# Patient Record
Sex: Female | Born: 1965
Health system: Southern US, Community
[De-identification: ages and names within clinical notes are randomized; demographics above are authoritative.]

## PROBLEM LIST (undated history)

## (undated) DIAGNOSIS — I10 Essential (primary) hypertension: Secondary | ICD-10-CM

## (undated) DIAGNOSIS — E0590 Thyrotoxicosis, unspecified without thyrotoxic crisis or storm: Secondary | ICD-10-CM

## (undated) DIAGNOSIS — M1990 Unspecified osteoarthritis, unspecified site: Secondary | ICD-10-CM

## (undated) HISTORY — PX: CERVIX LESION DESTRUCTION: SHX591

## (undated) HISTORY — PX: AUGMENTATION MAMMAPLASTY: SUR837

## (undated) HISTORY — PX: BREAST SURGERY: SHX581

---

## 2008-11-22 ENCOUNTER — Emergency Department (HOSPITAL_COMMUNITY): Admission: EM | Admit: 2008-11-22 | Discharge: 2008-11-22 | Payer: Self-pay | Admitting: Family Medicine

## 2009-12-06 ENCOUNTER — Emergency Department (HOSPITAL_COMMUNITY): Admission: EM | Admit: 2009-12-06 | Discharge: 2009-12-06 | Payer: Self-pay | Admitting: Family Medicine

## 2009-12-29 ENCOUNTER — Emergency Department (HOSPITAL_COMMUNITY): Admission: EM | Admit: 2009-12-29 | Discharge: 2009-12-29 | Payer: Self-pay | Admitting: Emergency Medicine

## 2010-03-30 ENCOUNTER — Encounter: Admission: RE | Admit: 2010-03-30 | Discharge: 2010-03-30 | Payer: Self-pay | Admitting: Internal Medicine

## 2010-04-18 ENCOUNTER — Encounter (HOSPITAL_COMMUNITY)
Admission: RE | Admit: 2010-04-18 | Discharge: 2010-06-27 | Payer: Self-pay | Source: Home / Self Care | Attending: Internal Medicine | Admitting: Internal Medicine

## 2010-08-11 LAB — RPR: RPR Ser Ql: NONREACTIVE

## 2010-08-11 LAB — DIFFERENTIAL
Basophils Relative: 0 % (ref 0–1)
Lymphs Abs: 2.2 10*3/uL (ref 0.7–4.0)
Monocytes Relative: 9 % (ref 3–12)
Neutro Abs: 1.6 10*3/uL — ABNORMAL LOW (ref 1.7–7.7)
Neutrophils Relative %: 37 % — ABNORMAL LOW (ref 43–77)

## 2010-08-11 LAB — CBC
Hemoglobin: 12.9 g/dL (ref 12.0–15.0)
MCHC: 33.7 g/dL (ref 30.0–36.0)
RBC: 4.74 MIL/uL (ref 3.87–5.11)
WBC: 4.4 10*3/uL (ref 4.0–10.5)

## 2010-08-11 LAB — TSH: TSH: 0.004 u[IU]/mL — ABNORMAL LOW (ref 0.350–4.500)

## 2010-08-13 LAB — POCT URINALYSIS DIP (DEVICE)
Glucose, UA: NEGATIVE mg/dL
Specific Gravity, Urine: 1.02 (ref 1.005–1.030)
Urobilinogen, UA: 1 mg/dL (ref 0.0–1.0)

## 2010-08-13 LAB — WET PREP, GENITAL: Yeast Wet Prep HPF POC: NONE SEEN

## 2011-02-24 ENCOUNTER — Emergency Department (HOSPITAL_COMMUNITY)
Admission: EM | Admit: 2011-02-24 | Discharge: 2011-02-25 | Disposition: A | Discharge status: Home / Self Care | Payer: Self-pay | Attending: Emergency Medicine | Admitting: Emergency Medicine

## 2011-02-24 DIAGNOSIS — E0590 Thyrotoxicosis, unspecified without thyrotoxic crisis or storm: Secondary | ICD-10-CM | POA: Insufficient documentation

## 2011-02-24 DIAGNOSIS — N949 Unspecified condition associated with female genital organs and menstrual cycle: Secondary | ICD-10-CM | POA: Insufficient documentation

## 2011-02-24 LAB — URINALYSIS, ROUTINE W REFLEX MICROSCOPIC
Glucose, UA: NEGATIVE mg/dL
Ketones, ur: NEGATIVE mg/dL
Leukocytes, UA: NEGATIVE
pH: 7 (ref 5.0–8.0)

## 2011-02-24 LAB — WET PREP, GENITAL
Trich, Wet Prep: NONE SEEN
Yeast Wet Prep HPF POC: NONE SEEN

## 2011-02-25 ENCOUNTER — Emergency Department (HOSPITAL_COMMUNITY): Payer: Self-pay

## 2012-03-08 ENCOUNTER — Encounter (HOSPITAL_COMMUNITY): Payer: Self-pay | Admitting: Pharmacist

## 2012-03-13 ENCOUNTER — Encounter (HOSPITAL_COMMUNITY): Payer: Self-pay

## 2012-03-13 ENCOUNTER — Encounter (HOSPITAL_COMMUNITY)
Admission: RE | Admit: 2012-03-13 | Discharge: 2012-03-13 | Disposition: A | Discharge status: Home / Self Care | Payer: 59 | Source: Ambulatory Visit | Attending: Obstetrics and Gynecology | Admitting: Obstetrics and Gynecology

## 2012-03-13 HISTORY — DX: Thyrotoxicosis, unspecified without thyrotoxic crisis or storm: E05.90

## 2012-03-13 LAB — CBC
MCH: 28.1 pg (ref 26.0–34.0)
MCV: 85.7 fL (ref 78.0–100.0)
Platelets: 216 10*3/uL (ref 150–400)
RDW: 14.9 % (ref 11.5–15.5)

## 2012-03-13 NOTE — Patient Instructions (Signed)
Your procedure is scheduled on:03/20/12  Enter through the Main Entrance at :6am Pick up desk phone and dial [phone] and inform us of your arrival.  Please call [phone] if you have any problems the morning of surgery.  Remember: Do not eat or drink after midnight:Wed  Take these meds the morning of surgery with a sip of water:none  DO NOT wear jewelry, eye make-up, lipstick,body lotion, or dark fingernail polish. Do not shave for 48 hours prior to surgery.   Patients discharged on the day of surgery will not be allowed to drive home.

## 2012-03-19 NOTE — H&P (Signed)
Sonya Lin is an 46 y.o. female. She is interested in permanent sterility.  She saw Dr. Ambrose Mantle in March and had a Paragard removed, but apparently one of the short arms broke off during removal and may still be in the endometrial cavity.  Options for permanent sterility have been discussed, she wants laparoscopic BTL.  Pertinent Gynecological History: Last pap: normal Date: 07-2011 OB History: G4, P4004   Menstrual History:  No LMP recorded.    Past Medical History  Diagnosis Date  . Hyperthyroidism     Past Surgical History  Procedure Date  . Cervix lesion destruction   . Breast surgery     No family history on file.  Social History:  reports that she has never smoked. She does not have any smokeless tobacco history on file. She reports that she does not drink alcohol or use illicit drugs.  Allergies:  Allergies  Allergen Reactions  . Penicillins Hives    Rash; whelps  . Erythromycin Rash    Rash; GI upset    No prescriptions prior to admission    Review of Systems  Respiratory: Negative.   Cardiovascular: Negative.   Gastrointestinal: Negative.   Genitourinary: Negative.     There were no vitals taken for this visit. Physical Exam  Constitutional: She appears well-developed and well-nourished.  Cardiovascular: Normal rate, regular rhythm and normal heart sounds.   No murmur heard. Respiratory: Effort normal and breath sounds normal. No respiratory distress.  GI: Soft. She exhibits no distension and no mass. There is no tenderness.  Genitourinary: Vagina normal and uterus normal.       Uterus normal size No adnexal mass    No results found for this or any previous visit (from the past 24 hour(s)).  No results found.  Assessment/Plan: Desires surgical sterility, and may have a short arm of a Paragard IUD retained in her endometrial cavity.  All medical and surgical options have been discussed, permanency of BTL, failure rate, risks of surgery.  Will  admit for laparoscopic BTL and hysteroscopy to see if there is a piece of her Paragard retained and remove it.  Dante Roudebush D 03/19/2012, 9:53 AM

## 2012-03-20 ENCOUNTER — Ambulatory Visit (HOSPITAL_COMMUNITY)
Admission: RE | Admit: 2012-03-20 | Discharge: 2012-03-20 | Disposition: A | Discharge status: Home / Self Care | Payer: 59 | Source: Ambulatory Visit | Attending: Obstetrics and Gynecology | Admitting: Obstetrics and Gynecology

## 2012-03-20 ENCOUNTER — Encounter (HOSPITAL_COMMUNITY)
Admission: RE | Disposition: A | Discharge status: Home / Self Care | Payer: Self-pay | Source: Ambulatory Visit | Attending: Obstetrics and Gynecology

## 2012-03-20 ENCOUNTER — Encounter (HOSPITAL_COMMUNITY): Payer: Self-pay | Admitting: Anesthesiology

## 2012-03-20 ENCOUNTER — Ambulatory Visit (HOSPITAL_COMMUNITY): Payer: 59 | Admitting: Anesthesiology

## 2012-03-20 DIAGNOSIS — Z302 Encounter for sterilization: Secondary | ICD-10-CM | POA: Insufficient documentation

## 2012-03-20 DIAGNOSIS — Z309 Encounter for contraceptive management, unspecified: Secondary | ICD-10-CM

## 2012-03-20 HISTORY — PX: IUD REMOVAL: SHX5392

## 2012-03-20 HISTORY — PX: LAPAROSCOPIC TUBAL LIGATION: SHX1937

## 2012-03-20 HISTORY — PX: HYSTEROSCOPY: SHX211

## 2012-03-20 SURGERY — LIGATION, FALLOPIAN TUBE, LAPAROSCOPIC
Anesthesia: General | Wound class: Clean Contaminated

## 2012-03-20 MED ORDER — PROPOFOL 10 MG/ML IV EMUL
INTRAVENOUS | Status: DC | PRN
  Administered 2012-03-20: 140 mg via INTRAVENOUS

## 2012-03-20 MED ORDER — LIDOCAINE HCL (CARDIAC) 20 MG/ML IV SOLN
INTRAVENOUS | Status: AC
  Filled 2012-03-20: qty 5

## 2012-03-20 MED ORDER — MIDAZOLAM HCL 2 MG/2ML IJ SOLN: 0.5000 mg | Freq: Once | INTRAMUSCULAR | Status: DC | PRN

## 2012-03-20 MED ORDER — FENTANYL CITRATE 0.05 MG/ML IJ SOLN
INTRAMUSCULAR | Status: DC | PRN
  Administered 2012-03-20 (×2): 50 ug via INTRAVENOUS

## 2012-03-20 MED ORDER — PROMETHAZINE HCL 25 MG/ML IJ SOLN: 6.2500 mg | INTRAMUSCULAR | Status: DC | PRN

## 2012-03-20 MED ORDER — LIDOCAINE HCL (CARDIAC) 20 MG/ML IV SOLN
INTRAVENOUS | Status: DC | PRN
  Administered 2012-03-20: 50 mg via INTRAVENOUS

## 2012-03-20 MED ORDER — NEOSTIGMINE METHYLSULFATE 1 MG/ML IJ SOLN
INTRAMUSCULAR | Status: AC
  Filled 2012-03-20: qty 10

## 2012-03-20 MED ORDER — LIDOCAINE HCL 2 % IJ SOLN
INTRAMUSCULAR | Status: AC
  Filled 2012-03-20: qty 20

## 2012-03-20 MED ORDER — FENTANYL CITRATE 0.05 MG/ML IJ SOLN: 25.0000 ug | INTRAMUSCULAR | Status: DC | PRN

## 2012-03-20 MED ORDER — KETOROLAC TROMETHAMINE 30 MG/ML IJ SOLN: 15.0000 mg | Freq: Once | INTRAMUSCULAR | Status: DC | PRN

## 2012-03-20 MED ORDER — LACTATED RINGERS IV SOLN
INTRAVENOUS | Status: DC
  Administered 2012-03-20 (×2): via INTRAVENOUS

## 2012-03-20 MED ORDER — GLYCOPYRROLATE 0.2 MG/ML IJ SOLN
INTRAMUSCULAR | Status: AC
  Filled 2012-03-20: qty 2

## 2012-03-20 MED ORDER — OXYCODONE-ACETAMINOPHEN 5-325 MG PO TABS: 1.0000 | ORAL_TABLET | ORAL | Status: DC | PRN

## 2012-03-20 MED ORDER — BUPIVACAINE HCL (PF) 0.25 % IJ SOLN
INTRAMUSCULAR | Status: DC | PRN
  Administered 2012-03-20: 8 mL

## 2012-03-20 MED ORDER — BUPIVACAINE HCL (PF) 0.25 % IJ SOLN
INTRAMUSCULAR | Status: AC
  Filled 2012-03-20: qty 30

## 2012-03-20 MED ORDER — NEOSTIGMINE METHYLSULFATE 1 MG/ML IJ SOLN
INTRAMUSCULAR | Status: DC | PRN
  Administered 2012-03-20: 3 mg via INTRAVENOUS

## 2012-03-20 MED ORDER — MIDAZOLAM HCL 5 MG/5ML IJ SOLN
INTRAMUSCULAR | Status: DC | PRN
  Administered 2012-03-20: 1 mg via INTRAVENOUS

## 2012-03-20 MED ORDER — FENTANYL CITRATE 0.05 MG/ML IJ SOLN
INTRAMUSCULAR | Status: AC
  Filled 2012-03-20: qty 5

## 2012-03-20 MED ORDER — ONDANSETRON HCL 4 MG/2ML IJ SOLN
INTRAMUSCULAR | Status: AC
  Filled 2012-03-20: qty 2

## 2012-03-20 MED ORDER — GLYCOPYRROLATE 0.2 MG/ML IJ SOLN
INTRAMUSCULAR | Status: DC | PRN
  Administered 2012-03-20: 0.6 mg via INTRAVENOUS

## 2012-03-20 MED ORDER — ONDANSETRON HCL 4 MG/2ML IJ SOLN
INTRAMUSCULAR | Status: DC | PRN
  Administered 2012-03-20: 4 mg via INTRAVENOUS

## 2012-03-20 MED ORDER — GLYCINE 1.5 % IR SOLN
Status: DC | PRN
  Administered 2012-03-20: 3000 mL

## 2012-03-20 MED ORDER — PROPOFOL 10 MG/ML IV EMUL
INTRAVENOUS | Status: AC
  Filled 2012-03-20: qty 20

## 2012-03-20 MED ORDER — ROCURONIUM BROMIDE 100 MG/10ML IV SOLN
INTRAVENOUS | Status: DC | PRN
  Administered 2012-03-20: 10 mg via INTRAVENOUS
  Administered 2012-03-20: 20 mg via INTRAVENOUS

## 2012-03-20 MED ORDER — LIDOCAINE HCL 2 % IJ SOLN
INTRAMUSCULAR | Status: DC | PRN
  Administered 2012-03-20: 16 mL

## 2012-03-20 MED ORDER — LACTATED RINGERS IV SOLN
INTRAVENOUS | Status: DC
  Administered 2012-03-20: 125 mL/h via INTRAVENOUS

## 2012-03-20 MED ORDER — DEXAMETHASONE SODIUM PHOSPHATE 10 MG/ML IJ SOLN
INTRAMUSCULAR | Status: DC | PRN
  Administered 2012-03-20: 10 mg via INTRAVENOUS

## 2012-03-20 MED ORDER — KETOROLAC TROMETHAMINE 30 MG/ML IJ SOLN
INTRAMUSCULAR | Status: AC
  Filled 2012-03-20: qty 1

## 2012-03-20 MED ORDER — MEPERIDINE HCL 25 MG/ML IJ SOLN: 6.2500 mg | INTRAMUSCULAR | Status: DC | PRN

## 2012-03-20 MED ORDER — DEXAMETHASONE SODIUM PHOSPHATE 10 MG/ML IJ SOLN
INTRAMUSCULAR | Status: AC
  Filled 2012-03-20: qty 1

## 2012-03-20 MED ORDER — KETOROLAC TROMETHAMINE 30 MG/ML IJ SOLN
INTRAMUSCULAR | Status: DC | PRN
  Administered 2012-03-20: 30 mg via INTRAVENOUS

## 2012-03-20 MED ORDER — MIDAZOLAM HCL 2 MG/2ML IJ SOLN
INTRAMUSCULAR | Status: AC
  Filled 2012-03-20: qty 2

## 2012-03-20 SURGICAL SUPPLY — 33 items
ABLATOR ENDOMETRIAL BIPOLAR (ABLATOR) IMPLANT
CANISTER SUCTION 2500CC (MISCELLANEOUS) ×3 IMPLANT
CATH ROBINSON RED A/P 16FR (CATHETERS) ×3 IMPLANT
CHLORAPREP W/TINT 26ML (MISCELLANEOUS) ×3 IMPLANT
CLOTH BEACON ORANGE TIMEOUT ST (SAFETY) ×3 IMPLANT
CONTAINER PREFILL 10% NBF 60ML (FORM) ×6 IMPLANT
DERMABOND ADVANCED (GAUZE/BANDAGES/DRESSINGS) ×1
DERMABOND ADVANCED .7 DNX12 (GAUZE/BANDAGES/DRESSINGS) ×2 IMPLANT
DRESSING TELFA 8X3 (GAUZE/BANDAGES/DRESSINGS) ×3 IMPLANT
ELECT REM PT RETURN 9FT ADLT (ELECTROSURGICAL)
ELECTRODE REM PT RTRN 9FT ADLT (ELECTROSURGICAL) IMPLANT
GLOVE BIO SURGEON STRL SZ8 (GLOVE) ×3 IMPLANT
GLOVE ORTHO TXT STRL SZ7.5 (GLOVE) ×6 IMPLANT
GOWN PREVENTION PLUS LG XLONG (DISPOSABLE) ×3 IMPLANT
GOWN PREVENTION PLUS XLARGE (GOWN DISPOSABLE) ×3 IMPLANT
GOWN STRL REIN XL XLG (GOWN DISPOSABLE) ×6 IMPLANT
LOOP ANGLED CUTTING 22FR (CUTTING LOOP) IMPLANT
NEEDLE INSUFFLATION 14GA 120MM (NEEDLE) ×3 IMPLANT
NEEDLE SPNL 22GX3.5 QUINCKE BK (NEEDLE) ×3 IMPLANT
PACK HYSTEROSCOPY LF (CUSTOM PROCEDURE TRAY) ×3 IMPLANT
PACK LAPAROSCOPY BASIN (CUSTOM PROCEDURE TRAY) ×3 IMPLANT
PACK VAGINAL MINOR WOMEN LF (CUSTOM PROCEDURE TRAY) ×3 IMPLANT
PAD OB MATERNITY 4.3X12.25 (PERSONAL CARE ITEMS) ×3 IMPLANT
PAD PREP 24X48 CUFFED NSTRL (MISCELLANEOUS) ×3 IMPLANT
SUT VIC AB 3-0 CTX 36 (SUTURE) IMPLANT
SUT VIC AB 3-0 PS2 18 (SUTURE) ×1
SUT VIC AB 3-0 PS2 18XBRD (SUTURE) ×2 IMPLANT
SUT VICRYL 0 UR6 27IN ABS (SUTURE) IMPLANT
SYR CONTROL 10ML LL (SYRINGE) ×3 IMPLANT
TOWEL OR 17X24 6PK STRL BLUE (TOWEL DISPOSABLE) ×6 IMPLANT
TROCAR Z-THREAD FIOS 11X100 BL (TROCAR) ×3 IMPLANT
WARMER LAPAROSCOPE (MISCELLANEOUS) ×3 IMPLANT
WATER STERILE IRR 1000ML POUR (IV SOLUTION) ×3 IMPLANT

## 2012-03-20 NOTE — Anesthesia Postprocedure Evaluation (Signed)
  Anesthesia Post-op Note  Patient: Sonya Lin  Procedure(s) Performed: Procedure(s) (LRB) with comments: LAPAROSCOPIC TUBAL LIGATION (Bilateral) HYSTEROSCOPY (N/A) INTRAUTERINE DEVICE (IUD) REMOVAL (N/A) - Attempted Removal of Portion Intra-Uterine Device  Patient Location: PACU  Anesthesia Type: General  Level of Consciousness: awake, alert  and oriented  Airway and Oxygen Therapy: Patient Spontanous Breathing  Post-op Pain: none  Post-op Assessment: Post-op Vital signs reviewed, Patient's Cardiovascular Status Stable, Respiratory Function Stable, Patent Airway, No signs of Nausea or vomiting and Pain level controlled  Post-op Vital Signs: Reviewed and stable  Complications: No apparent anesthesia complications

## 2012-03-20 NOTE — Interval H&P Note (Signed)
History and Physical Interval Note:  03/20/2012 7:11 AM  Efraim Kaufmann Laural Benes  has presented today for surgery, with the diagnosis of retained portion of IUD, surgical sterility  The various methods of treatment have been discussed with the patient and family. After consideration of risks, benefits and other options for treatment, the patient has consented to  Procedure(s) (LRB) with comments: LAPAROSCOPIC TUBAL LIGATION (Bilateral) HYSTEROSCOPY (N/A) INTRAUTERINE DEVICE (IUD) REMOVAL (N/A) - remove portion of IUD as a surgical intervention .  The patient's history has been reviewed, patient examined, no change in status, stable for surgery.  I have reviewed the patient's chart and labs.  Questions were answered to the patient's satisfaction.     Loghan Subia D

## 2012-03-20 NOTE — Transfer of Care (Signed)
Immediate Anesthesia Transfer of Care Note  Patient: Sonya Lin  Procedure(s) Performed: Procedure(s) (LRB) with comments: LAPAROSCOPIC TUBAL LIGATION (Bilateral) HYSTEROSCOPY (N/A) INTRAUTERINE DEVICE (IUD) REMOVAL (N/A) - Attempted Removal of Portion Intra-Uterine Device  Patient Location: PACU  Anesthesia Type: General  Level of Consciousness: sedated  Airway & Oxygen Therapy: Patient Spontanous Breathing and Patient connected to nasal cannula oxygen  Post-op Assessment: Report given to PACU RN  Post vital signs: Reviewed and stable  Complications: No apparent anesthesia complications

## 2012-03-20 NOTE — Op Note (Signed)
Preoperative diagnosis: Desires surgical sterility, possible retained portion of IUD Postoperative diagnosis: Same Procedure: Laparoscopic bilateral tubal fulguration, hysteroscopy Surgeon: Lavina Hamman M.D. Anesthesia: Gen. Endotracheal tube Findings: She had a normal pelvis, normal uterus, tubes and ovaries, normal upper abdomen, appendix and gallbladder, normal endometrial cavity with no evidence of retained IUD Estimated blood loss: Minimal Complications: None  Procedure in detail: The patient was taken to the operating room and placed in the dorsosupine position. General anesthesia was induced. Her left arm was tucked to her side and legs were placed in mobile stirrups. Abdomen was then prepped and draped in the usual sterile fashion, bladder drained with a red Robinson catheter, Hulka tenaculum applied to the cervix for uterine manipulation. Infraumbilical skin was then infiltrated with quarter percent Marcaine and a 1 cm horizontal incision was made. The Veress needle was inserted into the peritoneal cavity and placement confirmed by the water drop test and an opening pressure of 3 mm of mercury. CO2 was insufflated to a pressure of 14 mm of mercury and the Veress needle was removed. A 10/11 disposable trocar was then introduced with direct visualization with the laparoscope. The operating scope was then inserted. Good visualization was achieved, inspection revealed the above-mentioned findings. Both fallopian tubes were easily identified and traced to their fimbriated ends. A 3 cm portion of the middle of each tube was fulgurated with bipolar cautery until the amp meter read 0 in all spots along a 3 cm segment. This was done on both sides with good fulguration of both tubes and good hemostasis. No complications were encountered. The laparoscope was removed. All gas was allowed to deflate from the abdomen and the trocar was removed. Fascia was approximated with one suture of 0 Vicryl. Skin  incision was closed with interrupted subcuticular sutures of 4-0 Vicryl followed by Dermabond.   Attention was turned vaginally.  The legs were elevated in the mobile stirrups.  The Hulka tenaculum was removed. A graves speculum was inserted, the anterior lip of the cervix was grasped with a single tooth tenaculum.  Paracervical block performed with 16cc of 2% plain lidocaine.  Uterus sounded to 8 cm, the cervix was easily dilated to a size 19 dilator.  The observer hysteroscope was inserted and good visualization achieved.  The endometrial cavity was normal except for a divot anteriorly which could be from her IUD or the Hulka tenaculum.  No pieces of her IUD were visualized.  The hysteroscope was removed.  The tenaculum was removed and bleeding controlled with pressure.  All instruments were removed from the vagina.  She was taken down from the stirrups.  The patient was awakened in the operating room and taken to the recovery in stable condition after tolerating the procedure well. Counts were correct and she had PAS hose on throughout the procedure.

## 2012-03-20 NOTE — Anesthesia Preprocedure Evaluation (Addendum)
Anesthesia Evaluation  Patient identified by MRN, date of birth, ID band Patient awake    Reviewed: Allergy & Precautions, H&P , Patient's Chart, lab work & pertinent test results, reviewed documented beta blocker date and time   History of Anesthesia Complications Negative for: history of anesthetic complications  Airway Mallampati: II TM Distance: >3 FB Neck ROM: full    Dental No notable dental hx.    Pulmonary neg pulmonary ROS,  breath sounds clear to auscultation  Pulmonary exam normal       Cardiovascular Exercise Tolerance: Good negative cardio ROS  Rhythm:regular Rate:Normal     Neuro/Psych negative neurological ROS  negative psych ROS   GI/Hepatic negative GI ROS, Neg liver ROS,   Endo/Other  negative endocrine ROSHyperthyroidism   Renal/GU negative Renal ROS     Musculoskeletal   Abdominal   Peds  Hematology negative hematology ROS (+)   Anesthesia Other Findings   Reproductive/Obstetrics negative OB ROS                           Anesthesia Physical Anesthesia Plan  ASA: II  Anesthesia Plan: General ETT   Post-op Pain Management:    Induction: Intravenous  Airway Management Planned: Oral ETT  Additional Equipment:   Intra-op Plan:   Post-operative Plan: Extubation in OR  Informed Consent: I have reviewed the patients History and Physical, chart, labs and discussed the procedure including the risks, benefits and alternatives for the proposed anesthesia with the patient or authorized representative who has indicated his/her understanding and acceptance.     Plan Discussed with: CRNA, Surgeon and Anesthesiologist  Anesthesia Plan Comments:        Anesthesia Quick Evaluation

## 2012-03-21 ENCOUNTER — Encounter (HOSPITAL_COMMUNITY): Payer: Self-pay | Admitting: Obstetrics and Gynecology

## 2012-06-05 ENCOUNTER — Emergency Department (INDEPENDENT_AMBULATORY_CARE_PROVIDER_SITE_OTHER)
Admission: EM | Admit: 2012-06-05 | Discharge: 2012-06-05 | Disposition: A | Discharge status: Home / Self Care | Payer: BC Managed Care – PPO | Source: Home / Self Care | Attending: Emergency Medicine | Admitting: Emergency Medicine

## 2012-06-05 ENCOUNTER — Emergency Department (INDEPENDENT_AMBULATORY_CARE_PROVIDER_SITE_OTHER): Payer: BC Managed Care – PPO

## 2012-06-05 ENCOUNTER — Encounter (HOSPITAL_COMMUNITY): Payer: Self-pay | Admitting: Emergency Medicine

## 2012-06-05 DIAGNOSIS — I10 Essential (primary) hypertension: Secondary | ICD-10-CM

## 2012-06-05 DIAGNOSIS — M5412 Radiculopathy, cervical region: Secondary | ICD-10-CM

## 2012-06-05 MED ORDER — TRAMADOL HCL 50 MG PO TABS: 50.0000 mg | ORAL_TABLET | Freq: Once | ORAL | Status: DC

## 2012-06-05 MED ORDER — TRAMADOL HCL 50 MG PO TABS: 50.0000 mg | ORAL_TABLET | Freq: Four times a day (QID) | ORAL | Status: DC | PRN

## 2012-06-05 MED ORDER — PREDNISONE 20 MG PO TABS
60.0000 mg | ORAL_TABLET | Freq: Once | ORAL | Status: AC
  Administered 2012-06-05: 60 mg via ORAL

## 2012-06-05 MED ORDER — PREDNISONE 20 MG PO TABS: 40.0000 mg | ORAL_TABLET | Freq: Every day | ORAL | Status: DC

## 2012-06-05 MED ORDER — HYDROCODONE-ACETAMINOPHEN 5-325 MG PO TABS: 1.0000 | ORAL_TABLET | ORAL | Status: DC | PRN

## 2012-06-05 MED ORDER — PREDNISONE 20 MG PO TABS
ORAL_TABLET | ORAL | Status: AC
  Filled 2012-06-05: qty 3

## 2012-06-05 NOTE — ED Notes (Signed)
Pt c/o of right arm pain since 05/28/12 Sx include: intermittent numbness; pain that's constant and increases w/acitivity, and nauseas Denies: inj/trauma, fevers, vomiting, diarrhea  She is alert w/no signs of acute distress.

## 2012-06-05 NOTE — ED Provider Notes (Signed)
History     CSN: [id number]  Arrival date & time 06/05/12  1744   First MD Initiated Contact with Patient 06/05/12 1745      Chief Complaint  Patient presents with  . Arm Pain    HPI: Patient is a 47 y.o. female presenting with arm pain. The history is provided by the patient.  Arm Pain This is a new problem. The current episode started more than 1 week ago. The problem occurs every several days. The problem has been gradually worsening. Pertinent negatives include no chest pain, no headaches and no shortness of breath. The symptoms are aggravated by bending and twisting. The symptoms are relieved by rest.  Pt reports onset of (R) arm pain on 05/28/2012 that seemed to resolve after 2-3 days. The pain was predominately in the (R) AC area and made it difficult to straighten her arm. 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 The pain returned 2 to 3 days ago and now is associated w/ intermittent numbness and tingling to the fingers on her (R) hand. Today at work she felt symptoms worsen while she was attempting to unload a delivery truck. She deneis CP, dizziness, h/a or other neurological symptoms. Denies neck pain or weakness to the (R) arm.   Past Medical History  Diagnosis Date  . Hyperthyroidism     Past Surgical History  Procedure Date  . Cervix lesion destruction   . Breast surgery   . Laparoscopic tubal ligation 03/20/2012    Procedure: LAPAROSCOPIC TUBAL LIGATION;  Surgeon: Lavina Hamman, MD;  Location: [facility];  Service: Gynecology;  Laterality: Bilateral;  . Hysteroscopy 03/20/2012    Procedure: HYSTEROSCOPY;  Surgeon: Lavina Hamman, MD;  Location: [facility];  Service: Gynecology;  Laterality: N/A;  . Iud removal 03/20/2012    Procedure: INTRAUTERINE DEVICE (IUD) REMOVAL;  Surgeon: Lavina Hamman, MD;  Location: [facility];  Service: Gynecology;  Laterality:  N/A;  Attempted Removal of Portion Intra-Uterine Device    No family history on file.  History  Substance Use Topics  . Smoking status: Never Smoker   . Smokeless tobacco: Not on file  . Alcohol Use: No    OB History    Grav Para Term Preterm Abortions TAB SAB Ect Mult Living                  Review of Systems  Constitutional: Negative.   HENT: Negative.   Eyes: Negative.   Respiratory: Negative.  Negative for shortness of breath.   Cardiovascular: Negative for chest pain.  Genitourinary: Negative.   Musculoskeletal: Negative for back pain, joint swelling and arthralgias.  Neurological: Positive for numbness. Negative for dizziness, light-headedness and headaches.  Hematological: Negative.   Psychiatric/Behavioral: Negative.     Allergies  Penicillins and Erythromycin  Home Medications   Current Outpatient Rx  Name  Route  Sig  Dispense  Refill  . VITAMIN D 2000 UNITS PO TABS   Oral   Take 2,000 Units by mouth daily.         Marland Kitchen METHIMAZOLE 10 MG PO TABS   Oral   Take 10 mg by mouth 2 (two) times daily.         . OXYCODONE-ACETAMINOPHEN 5-325 MG PO TABS   Oral   Take 1-2 tablets by mouth every 4 (four) hours as needed for pain.   20 tablet  0     BP 160/98  Pulse 68  Temp 98.3 F (36.8 C) (Oral)  Resp 20  SpO2 100%  LMP 05/11/2012  Physical Exam  Constitutional: She is oriented to person, place, and time. She appears well-developed and well-nourished.  HENT:  Head: Normocephalic and atraumatic.  Eyes: Conjunctivae normal are normal.  Neck: Normal range of motion. Neck supple. No spinous process tenderness and no muscular tenderness present.  Cardiovascular: Normal rate.   Pulmonary/Chest: Effort normal.  Musculoskeletal:       Right elbow: tenderness found.       Some subjective pain to (R) AC w/ passive ROM. No objective findings.  Neurological: She is alert and oriented to person, place, and time. She has normal strength. No cranial nerve  deficit.       Grips equal bil    ED Course  Procedures (including critical care time)  Labs Reviewed - No data to display No results found.   No diagnosis found.    MDM  Approx 10 day h/o intermittent (R) arm pain that now is associated w/ intermittent numbness/tingling to fingers on (R) hand. No neck pain. No weakness of limb. Grips strong, equal bil. C-Spine films neg. Suspect cervical radiculopathy given sx's, exam and physical nature of job. Will treat w/ course of steroids, medication for pain and rest. Advised to f/u w/ PCP regarding f/u BP and (R) pain if not improving.         Leanne Chang, NP 06/05/12 2211

## 2012-06-06 NOTE — ED Provider Notes (Signed)
Medical screening examination/treatment/procedure(s) were performed by non-physician practitioner and as supervising physician I was immediately available for consultation/collaboration.  Jaydalyn Demattia   Gwendloyn Forsee, MD 06/06/12 1002 

## 2013-05-28 HISTORY — PX: BREAST BIOPSY: SHX20

## 2013-10-23 ENCOUNTER — Encounter (HOSPITAL_COMMUNITY): Payer: Self-pay | Admitting: Emergency Medicine

## 2013-10-23 ENCOUNTER — Emergency Department (HOSPITAL_COMMUNITY)
Admission: EM | Admit: 2013-10-23 | Discharge: 2013-10-23 | Disposition: A | Discharge status: Home / Self Care | Payer: Medicaid Other | Attending: Emergency Medicine | Admitting: Emergency Medicine

## 2013-10-23 DIAGNOSIS — Z880 Allergy status to penicillin: Secondary | ICD-10-CM | POA: Insufficient documentation

## 2013-10-23 DIAGNOSIS — M25569 Pain in unspecified knee: Secondary | ICD-10-CM | POA: Insufficient documentation

## 2013-10-23 DIAGNOSIS — Z8639 Personal history of other endocrine, nutritional and metabolic disease: Secondary | ICD-10-CM | POA: Insufficient documentation

## 2013-10-23 DIAGNOSIS — IMO0002 Reserved for concepts with insufficient information to code with codable children: Secondary | ICD-10-CM | POA: Insufficient documentation

## 2013-10-23 DIAGNOSIS — Z862 Personal history of diseases of the blood and blood-forming organs and certain disorders involving the immune mechanism: Secondary | ICD-10-CM | POA: Insufficient documentation

## 2013-10-23 DIAGNOSIS — Z79899 Other long term (current) drug therapy: Secondary | ICD-10-CM | POA: Insufficient documentation

## 2013-10-23 MED ORDER — TRAMADOL HCL 50 MG PO TABS: 50.0000 mg | ORAL_TABLET | Freq: Four times a day (QID) | ORAL | Status: DC | PRN

## 2013-10-23 NOTE — ED Provider Notes (Signed)
CSN: [id number]     Arrival date & time 10/23/13  2030 History  This chart was scribed for non-physician practitioner, Junius Creamer, FNP,working with Mirna Mires, MD, by Marlowe Kays, ED Scribe.  This patient was seen in room WTR8/WTR8 and the patient's care was started at 8:59 PM.  Chief Complaint  Patient presents with  . Knee Pain   The history is provided by the patient. No language interpreter was used.   HPI Comments:  Sonya Lin is a 48 y.o. female who presents to the Emergency Department complaining of intermittent aching posterior left knee pain that started about two weeks ago. Pt states sitting too long makes the pain worse. She states she has been putting a pillow under her knee for comfort at night. She states she has been taking Ibuprofen for the pain with mild relief. She denies taking oral contraceptive pills or any hormone therapy. She denies h/o DVT. Pt denies fever or numbness or tingling of the LLE. There is no redness, warmth or swelling of the area. She states she does not have a PCP.   Past Medical History  Diagnosis Date  . Hyperthyroidism    Past Surgical History  Procedure Laterality Date  . Cervix lesion destruction    . Breast surgery    . Laparoscopic tubal ligation  03/20/2012    Procedure: LAPAROSCOPIC TUBAL LIGATION;  Surgeon: Cheri Fowler, MD;  Location: [facility];  Service: Gynecology;  Laterality: Bilateral;  . Hysteroscopy  03/20/2012    Procedure: HYSTEROSCOPY;  Surgeon: Cheri Fowler, MD;  Location: [facility];  Service: Gynecology;  Laterality: N/A;  . Iud removal  03/20/2012    Procedure: INTRAUTERINE DEVICE (IUD) REMOVAL;  Surgeon: Cheri Fowler, MD;  Location: [facility];  Service: Gynecology;  Laterality: N/A;  Attempted Removal of Portion Intra-Uterine Device   History reviewed. No pertinent family history. History  Substance Use Topics  . Smoking status: Never Smoker   . Smokeless tobacco: Not on file  . Alcohol Use: No   OB History   Grav Para Term Preterm Abortions TAB SAB Ect Mult Living                 Review of Systems  Constitutional: Negative for fever.  Musculoskeletal: Positive for arthralgias (left knee).  Neurological: Negative for numbness.  All other systems reviewed and are negative.   Allergies  Penicillins and Erythromycin  Home Medications   Prior to Admission medications   Medication Sig Start Date End Date Taking? Authorizing Provider  Cholecalciferol (VITAMIN D) 2000 UNITS tablet Take 2,000 Units by mouth daily.    Historical Provider, MD  HYDROcodone-acetaminophen (NORCO/VICODIN) 5-325 MG per tablet Take 1 tablet by mouth every 4 (four) hours as needed for pain. 06/05/12   Rhetta Mura Schorr, NP  methimazole (TAPAZOLE) 10 MG tablet Take 10 mg by mouth 2 (two) times daily.    Historical Provider, MD  oxyCODONE-acetaminophen (ROXICET) 5-325 MG per tablet Take 1-2 tablets by mouth every 4 (four) hours as needed for pain. 03/20/12   Cheri Fowler, MD  predniSONE (DELTASONE) 20 MG tablet Take 2 tablets (40 mg total) by mouth daily. 06/05/12   Rhetta Mura Schorr, NP  traMADol (ULTRAM) 50 MG tablet Take 1 tablet (50 mg total) by mouth every 6 (six) hours as needed. 10/23/13   Garald Balding, NP   Triage Vitals: BP 142/86  Pulse 81  Temp(Src) 98.4 F (36.9 C)  Resp 20  Ht 4\' 6"  (1.372 m)  Wt 118  lb (53.524 kg)  BMI 28.43 kg/m2 Physical Exam  Nursing note and vitals reviewed. Constitutional: She is oriented to person, place, and time. She appears well-developed and well-nourished.  HENT:  Head: Normocephalic and atraumatic.  Eyes: EOM are normal.  Neck: Normal range of motion.  Cardiovascular: Normal rate.   Pulmonary/Chest: Effort normal.  Musculoskeletal: Normal range of motion. She exhibits no edema and no tenderness.  No extremity swelling. No discreet swelling of the posterior left knee. Unable to reproduce the pain by palpation.  Neurological: She is alert and oriented to person, place, and  time.  Skin: Skin is warm and dry.  Psychiatric: She has a normal mood and affect. Her behavior is normal.    ED Course  Procedures (including critical care time)  COORDINATION OF CARE: 9:03 PM- Advised pt to return tomorrow to have ultrasound of posterior left knee. Will prescribe pain medication. Pt verbalizes understanding and agrees to plan.  Medications - No data to display  Labs Review Labs Reviewed - No data to display  Imaging Review No results found.   EKG Interpretation None      MDM   Final diagnoses:  Knee joint pain       I personally performed the services described in this documentation, which was scribed in my presence. The recorded information has been reviewed and is accurate.    Garald Balding, NP 10/23/13 2117

## 2013-10-23 NOTE — Discharge Instructions (Signed)
Heat Therapy Heat therapy can help ease achy, tense, stiff, and tight muscles and joints. Heat should not be used on new injuries. Wait at least 48 hours after the injury before using heat therapy. Heat also should not be used for discomfort or pain that occurs right after doing an activity. If you still have pain or stiffness 3 hours after finishing the activity, then heat therapy may be used. PRECAUTIONS  High heat or prolonged exposure to heat can cause burns. Be careful when using heat therapy to avoid burning your skin. If you have any of the following conditions, do not use heat until you have discussed heat therapy with your caregiver:  Poor circulation.  Healing wounds or scarred skin in the area being treated.  Diabetes, heart disease, or high blood pressure.  Numbness of the area being treated.  Unusual swelling of the area being treated.  Active infections.  Blood clots.  Cancer.  Inability to communicate your response to pain. This can include young children and people with dementia. HOME CARE INSTRUCTIONS Moist heat pack  Soak a clean towel in warm water, and squeeze out the extra water. The water temperature should be comfortable to the skin.  Put the warm, wet towel in a plastic bag.  Place a thin, dry towel between your skin and the bag.  Put the heat pack on the area for 5 minutes, and check your skin. Your skin may be pink, but it should not be red.  Leave the heat pack on the area for a total of 15 to 30 minutes.  Repeat this every 2 to 4 hours while awake. Do not use heat while you are sleeping. Warm water bath  Fill a tub with warm water. The water temperature should be comfortable to the skin.  Place the affected body part in the tub.  Soak the area for 20 to 40 minutes.  Repeat as needed. Hot water bottle  Fill the water bottle half full with hot water.  Press out the extra air. Close the cap tightly.  Place a dry towel between your skin and  the bottle.  Put the bottle on the area for 5 minutes, and check your skin. Your skin may be pink, but it should not be red.  Leave the bottle on the area for a total of 15 to 30 minutes.  Repeat this every 2 to 4 hours while awake. Electric heating pad  Place a dry towel between your skin and the heating pad.  Set the heating pad on low heat.  Put the heating pad on the area for 10 minutes, and check your skin. Your skin may be pink, but it should not be red.  Leave the heating pad on the area for a total of 20 to 40 minutes.  Repeat this every 2 to 4 hours while awake.  Do not lie on the heating pad.  Do not fall asleep while using the heating pad.  Do not use the heating pad near water. Contact with water can result in an electrical shock. SEEK MEDICAL CARE IF:  You have blisters, redness, swelling, or numbness.  You have any new problems.  Your problems are getting worse.  You have any questions or concerns. If you develop any problems, stop using heat therapy until you see your caregiver. MAKE SURE YOU:  Understand these instructions.  Will watch your condition.  Will get help right away if you are not doing well or get worse. Document Released: 08/06/2011  Document Reviewed: 08/06/2011 °[employer]® Patient Information ©2014 [employer]. ° °

## 2013-10-23 NOTE — ED Notes (Signed)
Pt states that she is having pain that comes and goes in severe in the posterior of her Lt knee. Pt denies injury or pain to other location. Pain worsens with inactivity.

## 2013-10-24 ENCOUNTER — Ambulatory Visit (HOSPITAL_COMMUNITY)
Admission: RE | Admit: 2013-10-24 | Discharge: 2013-10-24 | Disposition: A | Discharge status: Home / Self Care | Payer: Medicaid Other | Source: Ambulatory Visit | Attending: Internal Medicine | Admitting: Internal Medicine

## 2013-10-24 DIAGNOSIS — M79609 Pain in unspecified limb: Secondary | ICD-10-CM | POA: Insufficient documentation

## 2013-10-24 DIAGNOSIS — M7989 Other specified soft tissue disorders: Secondary | ICD-10-CM | POA: Insufficient documentation

## 2013-10-24 NOTE — Progress Notes (Signed)
*  PRELIMINARY RESULTS* Vascular Ultrasound Left lower extremity venous duplex has been completed.  Preliminary findings: No evidence of DVT or baker's cyst.   Landry Mellow, RDMS, RVT  10/24/2013, 8:57 AM

## 2013-10-24 NOTE — ED Provider Notes (Signed)
Medical screening examination/treatment/procedure(s) were performed by non-physician practitioner and as supervising physician I was immediately available for consultation/collaboration.     Mirna Mires, MD 10/24/13 [phone]

## 2013-10-26 NOTE — Progress Notes (Signed)
VASCULAR LAB PRELIMINARY  PRELIMINARY  PRELIMINARY  PRELIMINARY  Left lower extremity venous duplex completed.    Preliminary report:  Left:  No evidence of DVT, superficial thrombosis, or Baker's cyst.  Sonya Lin, RVS 10/26/2013, 10:01 AM

## 2013-12-28 ENCOUNTER — Other Ambulatory Visit: Payer: Self-pay | Admitting: Family Medicine

## 2013-12-28 DIAGNOSIS — Z1231 Encounter for screening mammogram for malignant neoplasm of breast: Secondary | ICD-10-CM

## 2014-01-05 ENCOUNTER — Ambulatory Visit
Admission: RE | Admit: 2014-01-05 | Discharge: 2014-01-05 | Disposition: A | Discharge status: Home / Self Care | Payer: Medicaid Other | Source: Ambulatory Visit | Attending: Family Medicine | Admitting: Family Medicine

## 2014-01-05 DIAGNOSIS — Z1231 Encounter for screening mammogram for malignant neoplasm of breast: Secondary | ICD-10-CM

## 2014-01-08 ENCOUNTER — Other Ambulatory Visit: Payer: Self-pay | Admitting: Family Medicine

## 2014-01-08 DIAGNOSIS — R928 Other abnormal and inconclusive findings on diagnostic imaging of breast: Secondary | ICD-10-CM

## 2014-01-19 ENCOUNTER — Ambulatory Visit
Admission: RE | Admit: 2014-01-19 | Discharge: 2014-01-19 | Disposition: A | Discharge status: Home / Self Care | Payer: Medicaid Other | Source: Ambulatory Visit | Attending: Family Medicine | Admitting: Family Medicine

## 2014-01-19 ENCOUNTER — Other Ambulatory Visit: Payer: Self-pay | Admitting: Family Medicine

## 2014-01-19 DIAGNOSIS — R928 Other abnormal and inconclusive findings on diagnostic imaging of breast: Secondary | ICD-10-CM

## 2014-01-29 ENCOUNTER — Ambulatory Visit
Admission: RE | Admit: 2014-01-29 | Discharge: 2014-01-29 | Disposition: A | Discharge status: Home / Self Care | Payer: Medicaid Other | Source: Ambulatory Visit | Attending: Family Medicine | Admitting: Family Medicine

## 2014-01-29 DIAGNOSIS — R928 Other abnormal and inconclusive findings on diagnostic imaging of breast: Secondary | ICD-10-CM

## 2015-05-30 IMAGING — MG MM DIAGNOSTIC UNILATERAL R
2 series · 2 of 2 positions shown · non-contrast
Comparison: 01/05/2014 and 03/30/2010.

CLINICAL DATA: Recall from screening mammogram.

EXAM:
DIGITAL DIAGNOSTIC  right breast MAMMOGRAM

[R CC]
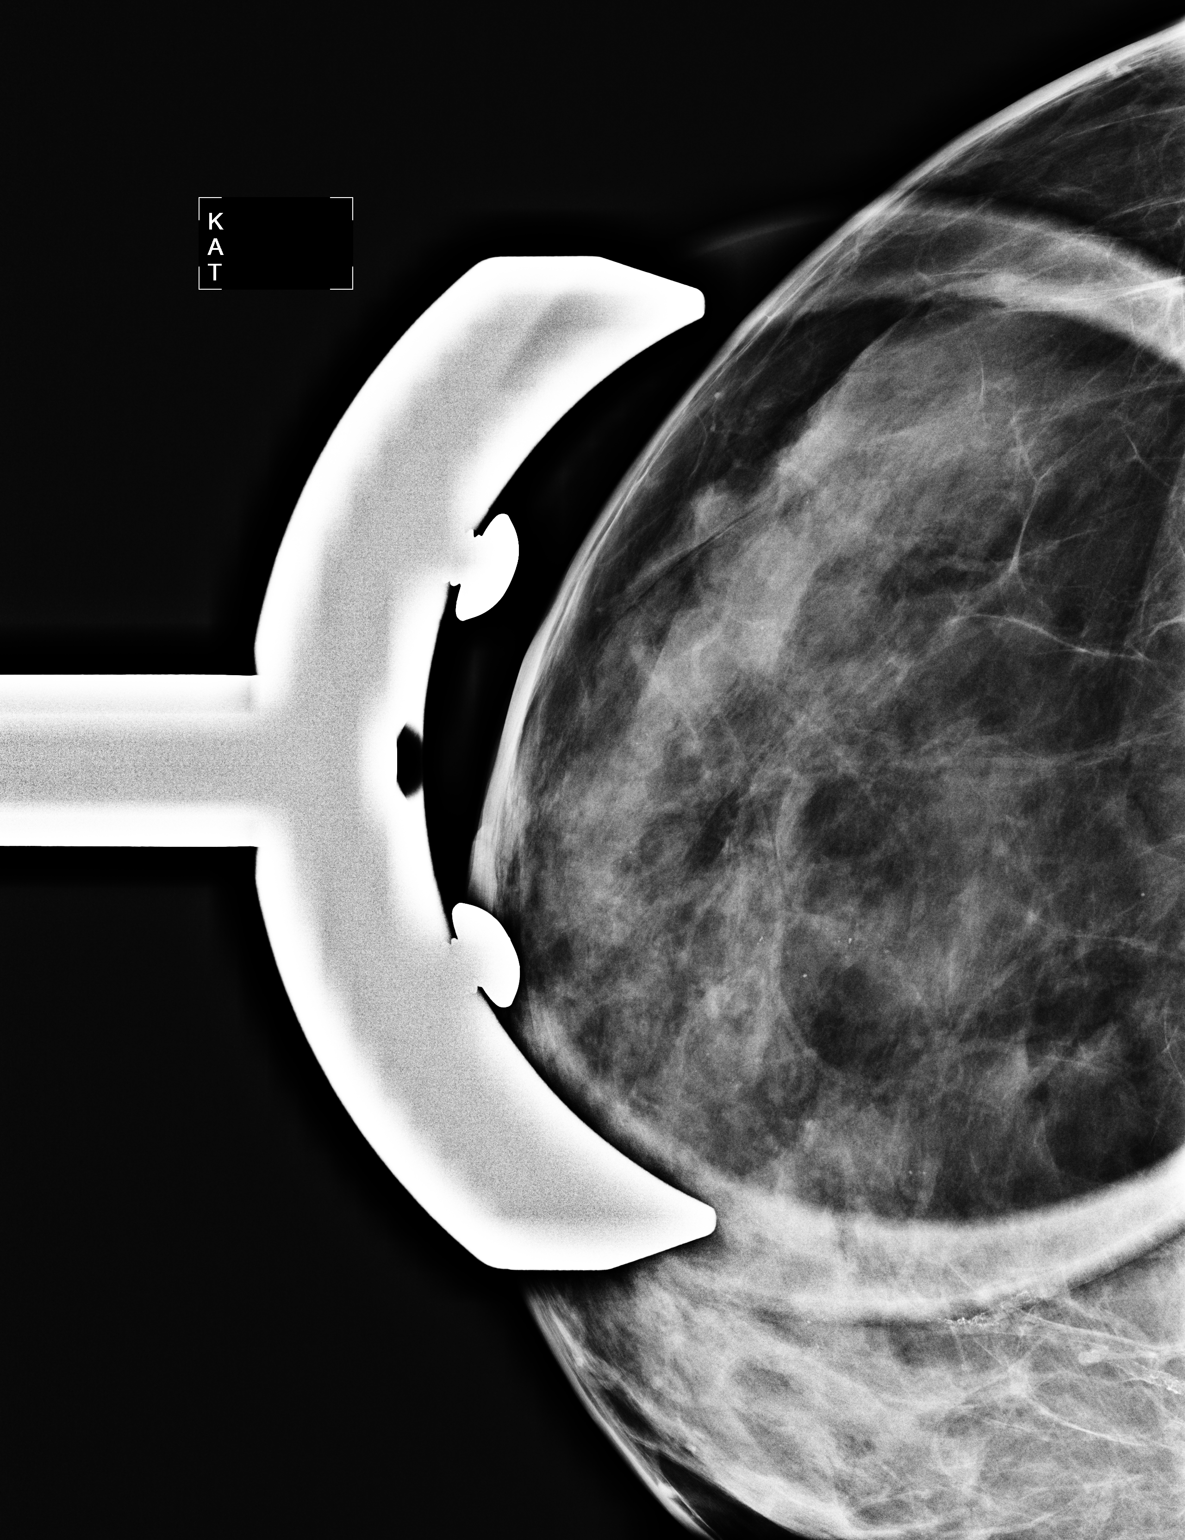

[R ML]
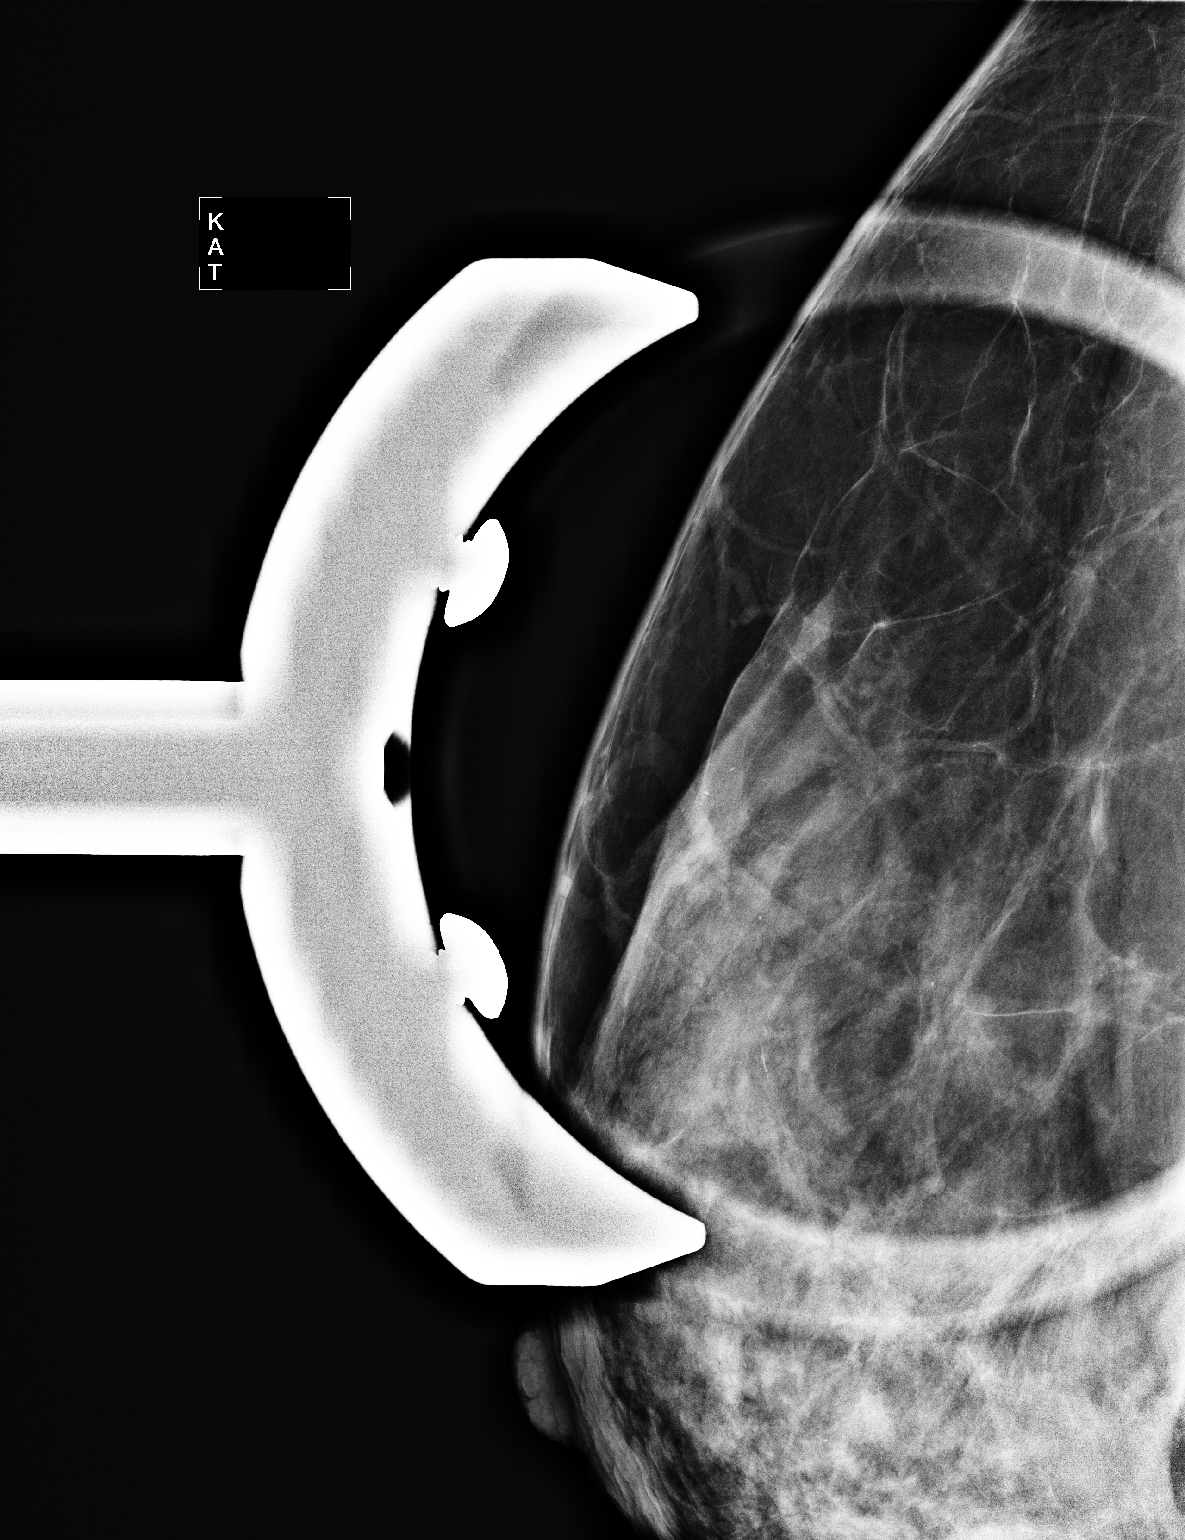

[2 of 2 positions shown; findings below may reference images not displayed]

ACR Breast Density Category c: The breast tissue is heterogeneously
dense, which may obscure small masses.
FINDINGS: Magnification views of the right breast demonstrate a group of
pleomorphic microcalcifications with linear forms located within the
right breast at the 12 o'clock position (anterior [DATE]). These are
arranged in a linear distribution and span 11 mm. There are
scattered punctate calcifications also present within the right
breast. Tissue sampling is recommended. I have discussed
stereotactic core biopsy of the calcifications with the patient.
This is scheduled for 01/29/2014.
IMPRESSION: Group of suspicious calcifications located superiorly within the
right breast at 12 o'clock position which span 1.1 cm. Tissue
sampling is recommended and stereotactic core biopsy is scheduled
for 01/29/2014.

RECOMMENDATION:
Right breast stereotactic core biopsy.

I have discussed the findings and recommendations with the patient.
Results were also provided in writing at the conclusion of the
visit. If applicable, a reminder letter will be sent to the patient
regarding the next appointment.

BI-RADS CATEGORY  4: Suspicious.

## 2015-06-28 ENCOUNTER — Other Ambulatory Visit: Payer: Self-pay

## 2015-06-28 DIAGNOSIS — N644 Mastodynia: Secondary | ICD-10-CM

## 2015-09-30 ENCOUNTER — Other Ambulatory Visit: Payer: Self-pay | Admitting: Nurse Practitioner

## 2015-09-30 DIAGNOSIS — Z1231 Encounter for screening mammogram for malignant neoplasm of breast: Secondary | ICD-10-CM

## 2015-12-29 ENCOUNTER — Emergency Department (HOSPITAL_COMMUNITY)
Admission: EM | Admit: 2015-12-29 | Discharge: 2015-12-30 | Disposition: A | Discharge status: Home / Self Care | Payer: BLUE CROSS/BLUE SHIELD | Attending: Emergency Medicine | Admitting: Emergency Medicine

## 2015-12-29 ENCOUNTER — Encounter (HOSPITAL_COMMUNITY): Payer: Self-pay | Admitting: Emergency Medicine

## 2015-12-29 DIAGNOSIS — M79602 Pain in left arm: Secondary | ICD-10-CM | POA: Insufficient documentation

## 2015-12-29 NOTE — ED Provider Notes (Signed)
[facility] DEPT Provider Note   CSN: [id number] Arrival date & time: 12/29/15  2106  First Provider Contact:  None       History   Chief Complaint Chief Complaint  Patient presents with  . Arm Pain    HPI Sonya Lin is a 50 y.o. female.  HPI   50 year old female presenting with L arm pain.  Pt report 3 days of L arm achiness, involving the lateral side of upper arm.  Pain is mild, no recent injury.  Pain with extension of the wrist and palpation.  No specific treatment tried.  Denies fever, chills, numbness, tingling, or weakness.  Pt is R arm dominant.  No hx of PE/DVT, no recent surgery, prolonged bedrest, recent travel, active cancer or taking oral hormone.  No neck pain, headache or fever.   Past Medical History:  Diagnosis Date  . Hyperthyroidism     Patient Active Problem List   Diagnosis Date Noted  . Contraception management 03/20/2012    Past Surgical History:  Procedure Laterality Date  . BREAST SURGERY    . CERVIX LESION DESTRUCTION    . HYSTEROSCOPY  03/20/2012   Procedure: HYSTEROSCOPY;  Surgeon: Cheri Fowler, MD;  Location: [facility];  Service: Gynecology;  Laterality: N/A;  . IUD REMOVAL  03/20/2012   Procedure: INTRAUTERINE DEVICE (IUD) REMOVAL;  Surgeon: Cheri Fowler, MD;  Location: [facility];  Service: Gynecology;  Laterality: N/A;  Attempted Removal of Portion Intra-Uterine Device  . LAPAROSCOPIC TUBAL LIGATION  03/20/2012   Procedure: LAPAROSCOPIC TUBAL LIGATION;  Surgeon: Cheri Fowler, MD;  Location: [facility];  Service: Gynecology;  Laterality: Bilateral;    OB History    No data available       Home Medications    Prior to Admission medications   Medication Sig Start Date End Date Taking? Authorizing Provider  Cholecalciferol (VITAMIN D) 2000 UNITS tablet Take 2,000 Units by mouth daily.    Historical Provider, MD  HYDROcodone-acetaminophen (NORCO/VICODIN) 5-325 MG per tablet Take 1 tablet by mouth every 4 (four) hours as needed for  pain. 06/05/12   Rhetta Mura Schorr, NP  methimazole (TAPAZOLE) 10 MG tablet Take 10 mg by mouth 2 (two) times daily.    Historical Provider, MD  oxyCODONE-acetaminophen (ROXICET) 5-325 MG per tablet Take 1-2 tablets by mouth every 4 (four) hours as needed for pain. 03/20/12   Cheri Fowler, MD  predniSONE (DELTASONE) 20 MG tablet Take 2 tablets (40 mg total) by mouth daily. 06/05/12   Rhetta Mura Schorr, NP  traMADol (ULTRAM) 50 MG tablet Take 1 tablet (50 mg total) by mouth every 6 (six) hours as needed. 10/23/13   Junius Creamer, NP    Family History No family history on file.  Social History Social History  Substance Use Topics  . Smoking status: Never Smoker  . Smokeless tobacco: Never Used  . Alcohol use No     Allergies   Penicillins and Erythromycin   Review of Systems Review of Systems  Constitutional: Negative for fever.  Skin: Negative for rash and wound.  Neurological: Negative for numbness.     Physical Exam Updated Vital Signs BP 145/83 (BP Location: Right Arm)   Pulse 84   Temp 98.1 F (36.7 C) (Oral)   Resp 20   Ht 4\' 7"  (1.397 m)   Wt 53.1 kg   SpO2 98%   BMI 27.19 kg/m   Physical Exam  Constitutional: She appears well-developed and well-nourished. No distress.  HENT:  Head:  Atraumatic.  Eyes: Conjunctivae are normal.  Neck: Neck supple.  Musculoskeletal: She exhibits tenderness (L arm: tenderness along the anterior region of bicep and posterior aspect of forearm without overlying skin changes or deformity.  NVI.  no rash.  FROM). She exhibits no edema.  Neurological: She is alert.  Skin: No rash noted.  Psychiatric: She has a normal mood and affect.  Nursing note and vitals reviewed.    ED Treatments / Results  Labs (all labs ordered are listed, but only abnormal results are displayed) Labs Reviewed - No data to display  EKG  EKG Interpretation None       Radiology No results found.  Procedures Procedures (including critical care  time)  Medications Ordered in ED Medications - No data to display   Initial Impression / Assessment and Plan / ED Course  I have reviewed the triage vital signs and the nursing notes.  Pertinent labs & imaging results that were available during my care of the patient were reviewed by me and considered in my medical decision making (see chart for details).  Clinical Course    BP 145/83 (BP Location: Right Arm)   Pulse 84   Temp 98.1 F (36.7 C) (Oral)   Resp 20   Ht 4\' 7"  (1.397 m)   Wt 53.1 kg   SpO2 98%   BMI 27.19 kg/m    Final Clinical Impressions(s) / ED Diagnoses   Final diagnoses:  Left arm pain    New Prescriptions New Prescriptions   No medications on file   12:06 AM Pt has reproducible L arm pain.  No signs concerning for infection or broken bones.  She is NVI.  Doubt DVT.  Suspect MSK.  Will treat with naproxen.  Return precaution discussed.     Domenic Moras, PA-C 12/30/15 0007    Tanna Furry, MD 01/10/16 [phone]

## 2015-12-29 NOTE — ED Triage Notes (Signed)
Patient presents for left arm pain radiating from right above elbow to left wrist x1 day. Denies injury to same, no SOB, no lightheadedness or dizziness, no numbness or tingling, no back or neck pain.

## 2015-12-30 MED ORDER — NAPROXEN 500 MG PO TABS: 500.0000 mg | ORAL_TABLET | Freq: Two times a day (BID) | ORAL | 0 refills | Status: DC

## 2020-05-27 ENCOUNTER — Ambulatory Visit (HOSPITAL_COMMUNITY): Payer: Self-pay

## 2020-06-01 ENCOUNTER — Encounter (HOSPITAL_COMMUNITY): Payer: Self-pay

## 2020-06-01 ENCOUNTER — Ambulatory Visit (HOSPITAL_COMMUNITY)
Admission: EM | Admit: 2020-06-01 | Discharge: 2020-06-01 | Disposition: A | Discharge status: Home / Self Care | Payer: BLUE CROSS/BLUE SHIELD

## 2020-06-01 DIAGNOSIS — M25512 Pain in left shoulder: Secondary | ICD-10-CM

## 2020-06-01 HISTORY — DX: Essential (primary) hypertension: I10

## 2020-06-01 MED ORDER — MELOXICAM 15 MG PO TABS: 15.0000 mg | ORAL_TABLET | Freq: Every day | ORAL | 0 refills | Status: DC

## 2020-06-01 NOTE — ED Provider Notes (Signed)
[facility]    CSN: [id number] Arrival date & time: 06/01/20  0932      History   Chief Complaint Chief Complaint  Patient presents with  . Shoulder Pain    HPI Sonya Lin is a 55 y.o. female with history of hypertension presents to urgent care with 1 month history of left shoulder pain. Patient describes dull, nagging pain for 1 month. States trouble sleeping as she likes to sleep on the side and is uncomfortable. 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 Yesterday with burning sensation in anterior shoulder. Denies any trauma, no numbness, weakness or tingling. Has taken ibuprofen with little relief. She is unable to identify any specific aggravating factors.   Past Medical History:  Diagnosis Date  . Hypertension     There are no problems to display for this patient.   Past Surgical History:  Procedure Laterality Date  . BREAST SURGERY      OB History   No obstetric history on file.      Home Medications    Prior to Admission medications   Medication Sig Start Date End Date Taking? Authorizing Provider  amLODipine (NORVASC) 5 MG tablet Take 5 mg by mouth daily.   Yes [provider]  meloxicam (MOBIC) 15 MG tablet Take 1 tablet (15 mg total) by mouth daily. 06/01/20 06/01/21 Yes Rolla Etienne, NP    Family History Family History  Problem Relation Age of Onset  . Healthy Mother   . Stroke Father   . Hypertension Father   . Diabetes Father     Social History Social History   Tobacco Use  . Smoking status: Never Smoker  . Smokeless tobacco: Never Used  Vaping Use  . Vaping Use: Never used  Substance Use Topics  . Alcohol use: Not Currently  . Drug use: Never     Allergies   Erythromycin and Penicillins   Review of Systems As stated in HPI otherwise negative   Physical Exam Triage Vital Signs ED Triage Vitals  Enc Vitals Group     BP 06/01/20 1201 140/85     Pulse Rate 06/01/20  1201 67     Resp 06/01/20 1201 16     Temp 06/01/20 1201 97.6 F (36.4 C)     Temp Source 06/01/20 1201 Oral     SpO2 06/01/20 1201 100 %     Weight 06/01/20 1157 115 lb (52.2 kg)     Height 06/01/20 1157 4\' 7"  (1.397 m)     Head Circumference --      Peak Flow --      Pain Score 06/01/20 1157 5     Pain Loc --      Pain Edu? --      Excl. in GC? --    No data found.  Updated Vital Signs BP 140/85 (BP Location: Right Arm)   Pulse 67   Temp 97.6 F (36.4 C) (Oral)   Resp 16   Ht 4\' 7"  (1.397 m)   Wt 52.2 kg   SpO2 100%   BMI 26.73 kg/m   Visual Acuity Right Eye Distance:   Left Eye Distance:   Bilateral Distance:    Right Eye Near:   Left Eye Near:    Bilateral Near:     Physical Exam Constitutional:      General: She is not in acute distress.    Appearance: Normal appearance. She is normal weight. She is not  ill-appearing.  HENT:     Head: Normocephalic and atraumatic.  Musculoskeletal:        General: Tenderness present. No swelling, deformity or signs of injury. Normal range of motion.     Cervical back: Normal range of motion and neck supple. No tenderness.     Comments: Normal range of motion. TTP along anterolateral joint space. No crepitus or popping. Normal strength. Discomfort exacerbated with arm raised overhead. No evidence of impingement  Neurological:     Mental Status: She is alert.      UC Treatments / Results  Labs (all labs ordered are listed, but only abnormal results are displayed) Labs Reviewed - No data to display  EKG   Radiology No results found.  Procedures Procedures (including critical care time)  Medications Ordered in UC Medications - No data to display  Initial Impression / Assessment and Plan / UC Course  I have reviewed the triage vital signs and the nursing notes.  Pertinent labs & imaging results that were available during my care of the patient were reviewed by me and considered in my medical decision making  (see chart for details).  Shoulder pain, left -Consider rotator cuff injury versus labral tear vs OA -Referral to orthopedics for further evaluation -NSAIDs, ice, rest  Reviewed expections re: course of current medical issues. Questions answered. Outlined signs and symptoms indicating need for more acute intervention. Pt verbalized understanding. AVS given   Final Clinical Impressions(s) / UC Diagnoses   Final diagnoses:  Pain in joint of left shoulder     Discharge Instructions     I am concerned you may have an injury to your rotator cuff.  I have referred you to an orthopedic doctor for further evaluation.  In the meantime ice, anti-inflammatory as we discussed and rest when able.    ED Prescriptions    Medication Sig Dispense Auth. Provider   meloxicam (MOBIC) 15 MG tablet Take 1 tablet (15 mg total) by mouth daily. 30 tablet Rudolpho Sevin, NP     PDMP not reviewed this encounter.   Rudolpho Sevin, NP 06/01/20 1258

## 2020-06-01 NOTE — Discharge Instructions (Signed)
I am concerned you may have an injury to your rotator cuff.  I have referred you to an orthopedic doctor for further evaluation.  In the meantime ice, anti-inflammatory as we discussed and rest when able.

## 2020-06-01 NOTE — ED Triage Notes (Signed)
Pt c/o left shoulder pain for approx 1 month. States pain increases, especially to anterior area with movement. 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

## 2020-08-02 ENCOUNTER — Emergency Department (HOSPITAL_COMMUNITY): Payer: BLUE CROSS/BLUE SHIELD

## 2020-08-02 ENCOUNTER — Other Ambulatory Visit: Payer: Self-pay

## 2020-08-02 ENCOUNTER — Encounter (HOSPITAL_COMMUNITY): Payer: Self-pay

## 2020-08-02 ENCOUNTER — Emergency Department (HOSPITAL_COMMUNITY)
Admission: EM | Admit: 2020-08-02 | Discharge: 2020-08-03 | Disposition: A | Discharge status: Home / Self Care | Payer: BLUE CROSS/BLUE SHIELD | Attending: Emergency Medicine | Admitting: Emergency Medicine

## 2020-08-02 DIAGNOSIS — M19012 Primary osteoarthritis, left shoulder: Secondary | ICD-10-CM | POA: Insufficient documentation

## 2020-08-02 DIAGNOSIS — M25512 Pain in left shoulder: Secondary | ICD-10-CM

## 2020-08-02 DIAGNOSIS — R202 Paresthesia of skin: Secondary | ICD-10-CM | POA: Diagnosis not present

## 2020-08-02 DIAGNOSIS — Z79899 Other long term (current) drug therapy: Secondary | ICD-10-CM | POA: Insufficient documentation

## 2020-08-02 DIAGNOSIS — E039 Hypothyroidism, unspecified: Secondary | ICD-10-CM | POA: Insufficient documentation

## 2020-08-02 DIAGNOSIS — I10 Essential (primary) hypertension: Secondary | ICD-10-CM | POA: Insufficient documentation

## 2020-08-02 DIAGNOSIS — M19019 Primary osteoarthritis, unspecified shoulder: Secondary | ICD-10-CM

## 2020-08-02 MED ORDER — IBUPROFEN 400 MG PO TABS
600.0000 mg | ORAL_TABLET | Freq: Once | ORAL | Status: AC
  Administered 2020-08-03: 600 mg via ORAL
  Filled 2020-08-02: qty 1

## 2020-08-02 MED ORDER — OXYCODONE-ACETAMINOPHEN 5-325 MG PO TABS
1.0000 | ORAL_TABLET | Freq: Once | ORAL | Status: AC
  Administered 2020-08-03: 1 via ORAL
  Filled 2020-08-02: qty 1

## 2020-08-02 NOTE — ED Provider Notes (Signed)
[facility] EMERGENCY DEPARTMENT Provider Note   CSN: [id number] Arrival date & time: 08/02/20  1945     History Chief Complaint  Patient presents with  . Shoulder Pain    Sonya Lin is a 55 y.o. female.  Patient to ED with complaint of recurrent left shoulder pain x 3 weeks without injury or trauma. She has received cortisone injections in the past but has been unable to follow up with her orthopedist secondary to financial restraints. No swelling redness or fever. She reports tingling into the fingers of the left hand. Denies weakness. She has been applying ice to the joint without relief.   The history is provided by the patient. No language interpreter was used.  Shoulder Pain Associated symptoms: no neck pain        Past Medical History:  Diagnosis Date  . Hypertension   . Hyperthyroidism     Patient Active Problem List   Diagnosis Date Noted  . Contraception management 03/20/2012    Past Surgical History:  Procedure Laterality Date  . BREAST SURGERY    . CERVIX LESION DESTRUCTION    . HYSTEROSCOPY  03/20/2012   Procedure: HYSTEROSCOPY;  Surgeon: Cheri Fowler, MD;  Location: [facility];  Service: Gynecology;  Laterality: N/A;  . IUD REMOVAL  03/20/2012   Procedure: INTRAUTERINE DEVICE (IUD) REMOVAL;  Surgeon: Cheri Fowler, MD;  Location: [facility];  Service: Gynecology;  Laterality: N/A;  Attempted Removal of Portion Intra-Uterine Device  . LAPAROSCOPIC TUBAL LIGATION  03/20/2012   Procedure: LAPAROSCOPIC TUBAL LIGATION;  Surgeon: Cheri Fowler, MD;  Location: [facility];  Service: Gynecology;  Laterality: Bilateral;     OB History   No obstetric history on file.     Family History  Problem Relation Age of Onset  . Healthy Mother   . Stroke Father   . Hypertension Father   . Diabetes Father     Social History   Tobacco Use  . Smoking status: Never Smoker  . Smokeless tobacco: Never Used  Vaping Use  . Vaping Use: Never used   Substance Use Topics  . Alcohol use: No  . Drug use: No    Home Medications Prior to Admission medications   Medication Sig Start Date End Date Taking? Authorizing Provider  amLODipine (NORVASC) 5 MG tablet Take 5 mg by mouth daily.    [provider]  Cholecalciferol (VITAMIN D) 2000 UNITS tablet Take 2,000 Units by mouth daily.    [provider]  HYDROcodone-acetaminophen (NORCO/VICODIN) 5-325 MG per tablet Take 1 tablet by mouth every 4 (four) hours as needed for pain. 06/05/12   Schorr, Rhetta Mura, FNP  meloxicam (MOBIC) 15 MG tablet Take 1 tablet (15 mg total) by mouth daily. 06/01/20 06/01/21  Rudolpho Sevin, NP  methimazole (TAPAZOLE) 10 MG tablet Take 10 mg by mouth 2 (two) times daily.    [provider]  naproxen (NAPROSYN) 500 MG tablet Take 1 tablet (500 mg total) by mouth 2 (two) times daily. 12/30/15   Domenic Moras, PA-C  oxyCODONE-acetaminophen (ROXICET) 5-325 MG per tablet Take 1-2 tablets by mouth every 4 (four) hours as needed for pain. 03/20/12   Meisinger, Todd, MD  predniSONE (DELTASONE) 20 MG tablet Take 2 tablets (40 mg total) by mouth daily. 06/05/12   Schorr, Rhetta Mura, FNP  traMADol (ULTRAM) 50 MG tablet Take 1 tablet (50 mg total) by mouth every 6 (six) hours as needed. 10/23/13   Junius Creamer, NP    Allergies  Penicillins, Erythromycin, Erythromycin, and Penicillins  Review of Systems   Review of Systems  Constitutional: Negative for chills.  Cardiovascular: Negative.   Gastrointestinal: Negative.  Negative for nausea.  Musculoskeletal: Negative for neck pain.       See HPI.  Skin: Negative.  Negative for color change.  Neurological: Negative.  Negative for weakness.       Tingling sensation to left fingers.    Physical Exam Updated Vital Signs BP (!) 176/97 (BP Location: Right Arm)   Pulse 72   Temp 98 F (36.7 C)   Resp 17   Ht 4\' 7"  (1.397 m)   Wt 52.2 kg   SpO2 100%   BMI 26.75 kg/m   Physical Exam Constitutional:       Appearance: She is well-developed and well-nourished.  Pulmonary:     Effort: Pulmonary effort is normal.  Musculoskeletal:     Cervical back: Normal range of motion.     Comments: Left shoulder has preserved range of motion. There is no swelling of the shoulder joint. No redness or warmth. Point tenderness over the [facility] joint, no deformity. Grip strength WNL. No midline or paracervical tenderness.   Skin:    General: Skin is warm and dry.  Neurological:     Mental Status: She is alert and oriented to person, place, and time.     Sensory: No sensory deficit.     ED Results / Procedures / Treatments   Labs (all labs ordered are listed, but only abnormal results are displayed) Labs Reviewed - No data to display  EKG None  Radiology DG Shoulder Left  Result Date: 08/02/2020 CLINICAL DATA:  Left shoulder pain radiating to neck, cortisone injection 2 weeks ago EXAM: LEFT SHOULDER - 2+ VIEW COMPARISON:  None. FINDINGS: Frontal and transscapular views of the left shoulder are obtained. Alignment is anatomic. Mild hypertrophic changes of the acromioclavicular joint. Glenohumeral joint space is well preserved. Left chest is clear. IMPRESSION: 1. Mild acromioclavicular joint osteoarthritis. No acute bony abnormality. Electronically Signed   By: Randa Ngo M.D.   On: 08/02/2020 20:44    Procedures Procedures   Medications Ordered in ED Medications  ibuprofen (ADVIL) tablet 600 mg (has no administration in time range)  oxyCODONE-acetaminophen (PERCOCET/ROXICET) 5-325 MG per tablet 1 tablet (has no administration in time range)    ED Course  I have reviewed the triage vital signs and the nursing notes.  Pertinent labs & imaging results that were available during my care of the patient were reviewed by me and considered in my medical decision making (see chart for details).    MDM Rules/Calculators/A&P                          Patient to ED with recurrent left shoulder pain  without injury. Has received injections in the past by ortho but unable to follow up with them currently.   No evidence of infection, injury. No cervical pain or tenderness, doubt radiculopathy. On imaging, there is AC joint osteoarthritis which is felt likely to be the cause of her pain.   Will provide ibuprofen, pain medication. Refer to PCP for further coordination of care.  Final Clinical Impression(s) / ED Diagnoses Final diagnoses:  None   1. Left shoulder arthritis 2. Acute left shoulder pain  Rx / DC Orders ED Discharge Orders    None       Charlann Lange, PA-C 73/22/02 [id number]    Delora Fuel,  MD 08/03/20 [phone]

## 2020-08-02 NOTE — ED Triage Notes (Signed)
Patient reports L shoulder pain, states she has some numbness in her fingers and the pain is starting to move to her neck. Patient reports hx of issues with same shoulder, had a cortisone shot that helped but now is having more pain.

## 2020-08-03 MED ORDER — IBUPROFEN 600 MG PO TABS: 600.0000 mg | ORAL_TABLET | Freq: Four times a day (QID) | ORAL | 0 refills | Status: DC | PRN

## 2020-08-03 MED ORDER — OXYCODONE-ACETAMINOPHEN 5-325 MG PO TABS: 1.0000 | ORAL_TABLET | Freq: Four times a day (QID) | ORAL | 0 refills | Status: DC | PRN

## 2020-08-03 NOTE — Discharge Instructions (Addendum)
Take ibuprofen as prescribed for pain and inflammation. This is best if taken on a regular basis. Take Percocet for pain as needed.   Follow up with your primary care provider for further management of recurrent shoulder pain. If you do not have a primary care provider, you can call the [facility] to make an appointment.

## 2020-08-25 ENCOUNTER — Ambulatory Visit (HOSPITAL_COMMUNITY)
Admission: RE | Admit: 2020-08-25 | Discharge: 2020-08-25 | Disposition: A | Discharge status: Home / Self Care | Payer: Medicaid Other | Source: Ambulatory Visit | Attending: Emergency Medicine | Admitting: Emergency Medicine

## 2020-08-25 ENCOUNTER — Encounter (HOSPITAL_COMMUNITY): Payer: Self-pay

## 2020-08-25 ENCOUNTER — Other Ambulatory Visit: Payer: Self-pay

## 2020-08-25 VITALS — BP 144/82 | HR 67 | Temp 98.3°F | Resp 16

## 2020-08-25 DIAGNOSIS — M25512 Pain in left shoulder: Secondary | ICD-10-CM

## 2020-08-25 MED ORDER — MELOXICAM 7.5 MG PO TABS: 7.5000 mg | ORAL_TABLET | Freq: Every day | ORAL | 0 refills | Status: DC

## 2020-08-25 NOTE — Discharge Instructions (Signed)
Take the meloxicam as prescribed.  Rest your shoulder.  Apply ice packs 2-3 times a day for up to 20 minutes each.    Follow up with your orthopedist.

## 2020-08-25 NOTE — ED Triage Notes (Signed)
Patient presents to Urgent Care with complaints of left shoulder pain describes it as burning sensation that radiates to left lower arm. Pt states this is an on-going issue since Jan. That she was treated for at ortho but due to loss of insurance unable to follow-up with ortho. She was seen again at the ED dx with arthritis and rx percocet which she is unable to tolerate. Pt states treating discomfort with ibuprofen and ice with some relief.   Denies fever, numbness or tingling sensation.

## 2020-08-25 NOTE — ED Provider Notes (Signed)
[place]    CSN: [id number] Arrival date & time: 08/25/20  0847      History   Chief Complaint Chief Complaint  Patient presents with  . Appointment    0900 left shoulder pain     HPI Sonya Lin is a 55 y.o. female.   Patient presents with ongoing left shoulder pain x3 months.  No falls or injury.  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  She reports intermittent "burning sensation" in her left shoulder and radiating down her left arm.  She denies weakness, numbness, wounds, redness, bruising, or other symptoms.  Patient was seen here on 06/01/2020; diagnosed with pain in the left shoulder; treated with meloxicam and instructed to follow-up with orthopedics.  Patient states she was seen by [facility] orthopedics and received an injection in her shoulder; she has not seen the orthopedist since January.  She was seen at [facility] ED on 08/02/2020; diagnosed with acute pain of left shoulder and shoulder arthritis; x-ray showed osteoarthritis; treated with ibuprofen and oxycodone-acetaminophen.  The history is provided by the patient and medical records.    Past Medical History:  Diagnosis Date  . Hypertension   . Hyperthyroidism     Patient Active Problem List   Diagnosis Date Noted  . Contraception management 03/20/2012    Past Surgical History:  Procedure Laterality Date  . BREAST SURGERY    . CERVIX LESION DESTRUCTION    . HYSTEROSCOPY  03/20/2012   Procedure: HYSTEROSCOPY;  Surgeon: Cheri Fowler, MD;  Location: [place] [facility];  Service: Gynecology;  Laterality: N/A;  . IUD REMOVAL  03/20/2012   Procedure: INTRAUTERINE DEVICE (IUD) REMOVAL;  Surgeon: Cheri Fowler, MD;  Location: [place] [facility];  Service: Gynecology;  Laterality: N/A;  Attempted Removal of Portion Intra-Uterine Device  . LAPAROSCOPIC TUBAL LIGATION  03/20/2012   Procedure: LAPAROSCOPIC TUBAL LIGATION;  Surgeon: Cheri Fowler, MD;  Location: [place] [facility];  Service:  Gynecology;  Laterality: Bilateral;    OB History   No obstetric history on file.      Home Medications    Prior to Admission medications   Medication Sig Start Date End Date Taking? Authorizing Provider  meloxicam (MOBIC) 7.5 MG tablet Take 1 tablet (7.5 mg total) by mouth daily. 08/25/20  Yes Sharion Balloon, NP  amLODipine (NORVASC) 5 MG tablet Take 5 mg by mouth daily.    [provider]  Cholecalciferol (VITAMIN D) 2000 UNITS tablet Take 2,000 Units by mouth daily.    [provider]  methimazole (TAPAZOLE) 10 MG tablet Take 10 mg by mouth 2 (two) times daily.    [provider]    Family History Family History  Problem Relation Age of Onset  . Healthy Mother   . Stroke Father   . Hypertension Father   . Diabetes Father     Social History Social History   Tobacco Use  . Smoking status: Never Smoker  . Smokeless tobacco: Never Used  Vaping Use  . Vaping Use: Never used  Substance Use Topics  . Alcohol use: No  . Drug use: No     Allergies   Penicillins, Erythromycin, Erythromycin, and Penicillins   Review of Systems Review of Systems  Constitutional: Negative for chills and fever.  HENT: Negative for ear pain and sore throat.   Eyes: Negative for pain and visual disturbance.  Respiratory: Negative for cough and shortness of breath.   Cardiovascular: Negative for  chest pain and palpitations.  Gastrointestinal: Negative for abdominal pain and vomiting.  Genitourinary: Negative for dysuria and hematuria.  Musculoskeletal: Positive for arthralgias. Negative for back pain and joint swelling.  Skin: Negative for color change, rash and wound.  Neurological: Negative for syncope, weakness and numbness.  All other systems reviewed and are negative.    Physical Exam Triage Vital Signs ED Triage Vitals  Enc Vitals Group     BP      Pulse      Resp      Temp      Temp src      SpO2      Weight      Height      Head  Circumference      Peak Flow      Pain Score      Pain Loc      Pain Edu?      Excl. in [place]?    No data found.  Updated Vital Signs BP (!) 144/82 (BP Location: Right Arm)   Pulse 67   Temp 98.3 F (36.8 C) (Oral)   Resp 16   SpO2 99%   Visual Acuity Right Eye Distance:   Left Eye Distance:   Bilateral Distance:    Right Eye Near:   Left Eye Near:    Bilateral Near:     Physical Exam Vitals and nursing note reviewed.  Constitutional:      General: She is not in acute distress.    Appearance: She is well-developed. She is not ill-appearing.  HENT:     Head: Normocephalic and atraumatic.     Mouth/Throat:     Mouth: Mucous membranes are moist.  Eyes:     Conjunctiva/sclera: Conjunctivae normal.  Cardiovascular:     Rate and Rhythm: Normal rate and regular rhythm.     Heart sounds: Normal heart sounds.  Pulmonary:     Effort: Pulmonary effort is normal. No respiratory distress.     Breath sounds: Normal breath sounds.  Abdominal:     Palpations: Abdomen is soft.     Tenderness: There is no abdominal tenderness.  Musculoskeletal:        General: Tenderness present. No swelling or deformity. Normal range of motion.     Cervical back: Neck supple.     Comments: Left shoulder: Tender to palpation of muscles.  No wounds, erythema, ecchymosis.  FROM, sensation intact, strength 5/5.  Skin:    General: Skin is warm and dry.     Capillary Refill: Capillary refill takes less than 2 seconds.     Findings: No bruising, erythema, lesion or rash.  Neurological:     General: No focal deficit present.     Mental Status: She is alert and oriented to person, place, and time.     Sensory: No sensory deficit.     Motor: No weakness.     Gait: Gait normal.  Psychiatric:        Mood and Affect: Mood normal.        Behavior: Behavior normal.      UC Treatments / Results  Labs (all labs ordered are listed, but only abnormal results are displayed) Labs Reviewed - No data to  display  EKG   Radiology No results found.  Procedures Procedures (including critical care time)  Medications Ordered in UC Medications - No data to display  Initial Impression / Assessment and Plan / UC Course  I have reviewed the triage vital signs  and the nursing notes.  Pertinent labs & imaging results that were available during my care of the patient were reviewed by me and considered in my medical decision making (see chart for details).   Acute left shoulder pain.  Treating with meloxicam.  Instructed patient to continue applying ice packs and rest her shoulder.  Discussed that she needs to follow-up with her orthopedist; She states she was previously seen by [facility].  Work note provided per patient request.  She agrees to plan of care.   Final Clinical Impressions(s) / UC Diagnoses   Final diagnoses:  Acute pain of left shoulder     Discharge Instructions     Take the meloxicam as prescribed.  Rest your shoulder.  Apply ice packs 2-3 times a day for up to 20 minutes each.    Follow up with your orthopedist.       ED Prescriptions    Medication Sig Dispense Auth. Provider   meloxicam (MOBIC) 7.5 MG tablet Take 1 tablet (7.5 mg total) by mouth daily. 14 tablet Sharion Balloon, NP     I have reviewed the PDMP during this encounter.   Sharion Balloon, NP 08/25/20 [phone]

## 2020-09-18 ENCOUNTER — Ambulatory Visit: Payer: Self-pay

## 2020-12-16 ENCOUNTER — Encounter: Payer: Self-pay | Admitting: Family Medicine

## 2020-12-16 ENCOUNTER — Other Ambulatory Visit: Payer: Self-pay

## 2020-12-16 ENCOUNTER — Ambulatory Visit (INDEPENDENT_AMBULATORY_CARE_PROVIDER_SITE_OTHER): Payer: BLUE CROSS/BLUE SHIELD | Admitting: Family Medicine

## 2020-12-16 VITALS — BP 149/85 | HR 73 | Ht <= 58 in | Wt 128.4 lb

## 2020-12-16 DIAGNOSIS — M25512 Pain in left shoulder: Secondary | ICD-10-CM | POA: Diagnosis not present

## 2020-12-16 DIAGNOSIS — G8929 Other chronic pain: Secondary | ICD-10-CM

## 2020-12-16 DIAGNOSIS — E0500 Thyrotoxicosis with diffuse goiter without thyrotoxic crisis or storm: Secondary | ICD-10-CM

## 2020-12-16 DIAGNOSIS — I10 Essential (primary) hypertension: Secondary | ICD-10-CM | POA: Diagnosis not present

## 2020-12-16 MED ORDER — AMLODIPINE BESYLATE 5 MG PO TABS
5.0000 mg | ORAL_TABLET | Freq: Every day | ORAL | 3 refills | Status: DC
Start: 2020-12-16 — End: 2021-03-05

## 2020-12-16 NOTE — Progress Notes (Signed)
   SUBJECTIVE:   CHIEF COMPLAINT / HPI:      Sonya Lin is a 55 y.o. female here to establish care. Reports shoulder pain.    New Patient : PMHx: reviewed    Meds: reviewed    ALL: reviewed    SurgHx: reviewed    [id number]   FMHx: reviewed    Social Hx:  Lives with her husband. Has 4 children.  Works at [employer]. Gets her exercise at work. Denies smoking, alcohol and illicit drug use.   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 Taking Tylenol arthritis for pain with relief. Tried cold and heat compress. Pain is worse in the morning and it feels stiff.    PERTINENT  PMH / PSH: reviewed and updated as appropriate   OBJECTIVE:   BP (!) 149/85   Pulse 73   Ht '4\' 7"'$  (1.397 m)   Wt 128 lb 6.4 oz (58.2 kg)   SpO2 98%   BMI 29.84 kg/m    GEN: pleasant well appearing female, in no acute distress  NECK: no goiter or palpable thyroid nodule, normal ROM CV: regular rate and rhythm, no murmurs appreciated  RESP: no increased work of breathing, clear to ascultation bilaterally MSK: no LE edema,  Shoulder, left: No evidence of bony deformity, asymmetry, or muscle atrophy; No tenderness over long head of biceps (bicipital groove). +TTP at [facility] joint. Full passive range of motion, pain with active extension, C and T spine without significant tenderness. Strength 5/5 throughout. No abnormal scapular function observed. Sensation intact. Peripheral pulses intact. Negative Neer's, Negative Hawkins, Positive Lift Off test  SKIN: warm, dry NEURO: gross sensation intact, moves all extremities appropriately PSYCH: Normal affect, appropriate speech and behavior    ASSESSMENT/PLAN:   Graves disease Previously followed by endocrinologist at [facility] in [place].  Previously took methimazole.  Last TSH was normal.  Repeat TSH and T4 today.  Shoulder pain,  left AC joint osteoarthritis on imaging reviewed.  No evidence of infection.  Previously took meloxicam but it did not help.  Reports improvement with Tylenol arthritis.  Continue applying ice and heat, topical analgesics as needed.  Rest her shoulder and stretch after work.  Previously had a corticosteroid shot at [place] but states that this did not help her pain.  Question rotator cuff tendon microtears.  Referral placed to sports medicine for possible shoulder ultrasound.  Continue multimodal OTC treatment. Imaging deferred.   Hypertension Continue amlodipine 5 mg daily. She has been out of her medication. BP today not at goal, 149/85.    Health care maintenance  Due for PAP  - schedule for 01/11/21  Colonoscopy - reports completed in [place] 3-4 years ago, records release signed    Sonya Hensen, DO PGY-3, [facility] 12/16/2020

## 2020-12-16 NOTE — Patient Instructions (Addendum)
It was great seeing you today! Your shoulder pain is likely due to arthritis but you may also have small tears in your tendons in your rotator cuff.   As discusssed, continue taking the topicals, Alleve and Tylenol for pain. Look out for a phone call to scheudle your sports medicine appoitment.   [place] [facility]  [phone]  Regarding your lab work, I will you a Mychart message regarding your labs. Schedule an appointment with endorcrinology.   I'd like to see you back 01/11/21 at 2:10 PM for a PAP smear if your pain does not improve but if you need to be seen earlier than that for any new issues we're happy to fit you in, just give [place] a call!    Take care,   [place]

## 2020-12-17 LAB — TSH+FREE T4
Free T4: 1.07 ng/dL (ref 0.82–1.77)
TSH: 1.2 u[IU]/mL (ref 0.450–4.500)

## 2020-12-18 DIAGNOSIS — I10 Essential (primary) hypertension: Secondary | ICD-10-CM | POA: Insufficient documentation

## 2020-12-18 DIAGNOSIS — M25512 Pain in left shoulder: Secondary | ICD-10-CM | POA: Insufficient documentation

## 2020-12-18 DIAGNOSIS — E0500 Thyrotoxicosis with diffuse goiter without thyrotoxic crisis or storm: Secondary | ICD-10-CM | POA: Insufficient documentation

## 2020-12-18 NOTE — Assessment & Plan Note (Signed)
AC joint osteoarthritis on imaging reviewed.  No evidence of infection.  Previously took meloxicam but it did not help.  Reports improvement with Tylenol arthritis.  Continue applying ice and heat, topical analgesics as needed.  Rest her shoulder and stretch after work.  Previously had a corticosteroid shot at [place] but states that this did not help her pain.  Question rotator cuff tendon microtears.  Referral placed to sports medicine for possible shoulder ultrasound.  Continue multimodal OTC treatment. Imaging deferred.

## 2020-12-18 NOTE — Assessment & Plan Note (Signed)
Previously followed by endocrinologist at [facility] in [place].  Previously took methimazole.  Last TSH was normal.  Repeat TSH and T4 today.

## 2020-12-18 NOTE — Assessment & Plan Note (Signed)
Continue amlodipine 5 mg daily. She has been out of her medication. BP today not at goal, 149/85.

## 2020-12-28 ENCOUNTER — Ambulatory Visit: Payer: Self-pay

## 2020-12-28 ENCOUNTER — Encounter: Payer: Self-pay | Admitting: Family Medicine

## 2020-12-28 ENCOUNTER — Other Ambulatory Visit: Payer: Self-pay

## 2020-12-28 ENCOUNTER — Ambulatory Visit (INDEPENDENT_AMBULATORY_CARE_PROVIDER_SITE_OTHER): Payer: BLUE CROSS/BLUE SHIELD | Admitting: Family Medicine

## 2020-12-28 VITALS — BP 112/82 | Ht <= 58 in | Wt 125.0 lb

## 2020-12-28 DIAGNOSIS — M25512 Pain in left shoulder: Secondary | ICD-10-CM

## 2020-12-28 DIAGNOSIS — G8929 Other chronic pain: Secondary | ICD-10-CM

## 2020-12-28 MED ORDER — DICLOFENAC SODIUM 75 MG PO TBEC: 75.0000 mg | DELAYED_RELEASE_TABLET | Freq: Two times a day (BID) | ORAL | 1 refills | Status: DC

## 2020-12-28 NOTE — Progress Notes (Signed)
PCP: [name], DO  Subjective:   HPI: Patient is a 55 y.o. female here for left shoulder pain.  Patient reports since December she's had superior left shoulder pain. 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 Pain worse with this reaching. Tried mobic without much benefit. Tylenol does help some. Topicals have not helped. Worse in the morning also. Went to [place] and recalls an anterior shoulder injection which did not help and actually made pain worse. No prior issues.  Past Medical History:  Diagnosis Date   Hypertension    Hyperthyroidism     Current Outpatient Medications on File Prior to Visit  Medication Sig Dispense Refill   amLODipine (NORVASC) 5 MG tablet Take 1 tablet (5 mg total) by mouth daily. 30 tablet 3   Cholecalciferol (VITAMIN D) 2000 UNITS tablet Take 2,000 Units by mouth daily.     No current facility-administered medications on file prior to visit.    Past Surgical History:  Procedure Laterality Date   BREAST SURGERY     CERVIX LESION DESTRUCTION     HYSTEROSCOPY  03/20/2012   Procedure: HYSTEROSCOPY;  Surgeon: Cheri Fowler, MD;  Location: [place] [facility];  Service: Gynecology;  Laterality: N/A;   IUD REMOVAL  03/20/2012   Procedure: INTRAUTERINE DEVICE (IUD) REMOVAL;  Surgeon: Cheri Fowler, MD;  Location: [place] [facility];  Service: Gynecology;  Laterality: N/A;  Attempted Removal of Portion Intra-Uterine Device   LAPAROSCOPIC TUBAL LIGATION  03/20/2012   Procedure: LAPAROSCOPIC TUBAL LIGATION;  Surgeon: Cheri Fowler, MD;  Location: [facility] [facility];  Service: Gynecology;  Laterality: Bilateral;    Allergies  Allergen Reactions   Penicillins Hives    Rash; whelps   Erythromycin Rash    Rash; GI upset   Erythromycin Rash   Penicillins Rash    Social History   Socioeconomic History   Marital status: Single    Spouse name: Not on file   Number of children: Not on file   Years of education: Not on file   Highest  education level: Not on file  Occupational History   Not on file  Tobacco Use   Smoking status: Never   Smokeless tobacco: Never  Vaping Use   Vaping Use: Never used  Substance and Sexual Activity   Alcohol use: No   Drug use: No   Sexual activity: Not on file  Other Topics Concern   Not on file  Social History Narrative   ** Merged History Encounter **       Social Determinants of Health   Financial Resource Strain: Not on file  Food Insecurity: Not on file  Transportation Needs: Not on file  Physical Activity: Not on file  Stress: Not on file  Social Connections: Not on file  Intimate Partner Violence: Not on file    Family History  Problem Relation Age of Onset   Healthy Mother    Stroke Father    Hypertension Father    Diabetes Father     BP 112/82   Ht '4\' 7"'$  (1.397 m)   Wt 125 lb (56.7 kg)   BMI 29.05 kg/m   No flowsheet data found.  No flowsheet data found.  Review of Systems: See HPI above.     Objective:  Physical Exam:  Gen: NAD, comfortable in exam room  Left shoulder: No swelling, ecchymoses.  No gross deformity. TTP AC joint. FROM. Negative Hawkins, Neers. Negative Yergasons. Strength 5/5 with empty can and resisted internal/external rotation. Negative  apprehension. NV intact distally.   Complete MSK u/s left shoulder: Biceps tendon: intact without tenosynovitis Pec major tendon: intact Subscapularis: intact without abnormalities AC joint: mild arthropathy with effusion, neovascularity of joint capsule, pain with probe compression Infraspinatus: intact Supraspinatus: intact with mild overlying subacromial bursitis Posterior glenohumeral joint: no effusion, labral abnormalities.  Impression: AC joint arthropathy with effusion, active inflammation.  Mild subacromial bursitis.  Assessment & Plan:  1. Left shoulder pain - primarily due to [facility] arthritis with some mild bursitis noted on ultrasound.  Start diclofenac twice a day with  tylenol.  Icing.  Home exercises reviewed.  Consider AC joint injection, formal physical therapy.

## 2020-12-28 NOTE — Patient Instructions (Signed)
Your pain is due to [facility] arthritis. Start diclofenac '75mg'$  twice a day with food for pain and inflammation. Don't take meloxicam, ibuprofen, or aleve. Ok to take tylenol with this though. Start home exercises daily as directed. Consider AC joint injection, physical therapy if not improving - call me if this is the case. F/u in 6 weeks. If you need a note for light duty let me know also.

## 2021-01-11 ENCOUNTER — Ambulatory Visit (INDEPENDENT_AMBULATORY_CARE_PROVIDER_SITE_OTHER): Payer: BLUE CROSS/BLUE SHIELD | Admitting: Family Medicine

## 2021-01-11 ENCOUNTER — Other Ambulatory Visit: Payer: Self-pay

## 2021-01-11 ENCOUNTER — Encounter: Payer: Self-pay | Admitting: Family Medicine

## 2021-01-11 ENCOUNTER — Other Ambulatory Visit (HOSPITAL_COMMUNITY)
Admission: RE | Admit: 2021-01-11 | Discharge: 2021-01-11 | Disposition: A | Discharge status: Home / Self Care | Payer: BLUE CROSS/BLUE SHIELD | Source: Ambulatory Visit | Attending: Family Medicine | Admitting: Family Medicine

## 2021-01-11 VITALS — BP 148/88 | HR 62 | Ht <= 58 in | Wt 126.4 lb

## 2021-01-11 DIAGNOSIS — Z124 Encounter for screening for malignant neoplasm of cervix: Secondary | ICD-10-CM | POA: Diagnosis present

## 2021-01-11 DIAGNOSIS — Z1231 Encounter for screening mammogram for malignant neoplasm of breast: Secondary | ICD-10-CM | POA: Diagnosis not present

## 2021-01-11 NOTE — Patient Instructions (Signed)
I will send you a letter with your results. If abnormal, I will call you.   Take Care,  Dr. Susa Simmonds

## 2021-01-11 NOTE — Progress Notes (Signed)
    Subjective:     Sonya Lin is a 55 y.o. woman who comes in today for a  pap smear only. Her most recent Pap smear was normal per pt [place]. Contraception: tubal ligation. No LMP recorded. Patient is perimenopausal. Has intermittent menses every 3-4 months. Her mom and aunts went into their 69s as well. Her shoulder pain is much improved.    Patient does not smoke. Drinks socially, maybe twice a month. Denies illicit drug use.   Review of Systems A comprehensive review of systems was negative.   Objective:    BP (!) 148/88   Pulse 62   Ht '4\' 7"'$  (1.397 m)   Wt 126 lb 6.4 oz (57.3 kg)   SpO2 100%   BMI 29.38 kg/m  GEN: well appearing female in no acute distress  CVS: well perfused  RESP: speaking in full sentences without pause, no respiratory distress  Pelvic Exam: cervix normal in appearance, no cervical motion tenderness, uterus normal size, shape, and consistency, External genitalia normal, and Vagina normal without discharge. Pap smear obtained.  Adnexa without masses or palpable abnormality.   Assessment:    Screening pap smear.   Plan:   Follow up on PAP. Declined STI testing. Tubal ligation for contraception. Records release obtained for previous PAP.   Colonoscopy 4 years ago in [place] and was normal, per patient.  Mammogram - discussed and ordered.     [name], DO PGY-3, [facility] 01/11/2021

## 2021-01-12 LAB — CYTOLOGY - PAP
Comment: NEGATIVE
Diagnosis: NEGATIVE
High risk HPV: NEGATIVE

## 2021-02-06 ENCOUNTER — Encounter: Payer: Self-pay | Admitting: Emergency Medicine

## 2021-02-06 ENCOUNTER — Ambulatory Visit
Admission: EM | Admit: 2021-02-06 | Discharge: 2021-02-06 | Disposition: A | Discharge status: Home / Self Care | Payer: BLUE CROSS/BLUE SHIELD | Attending: Emergency Medicine | Admitting: Emergency Medicine

## 2021-02-06 ENCOUNTER — Other Ambulatory Visit: Payer: Self-pay

## 2021-02-06 DIAGNOSIS — R42 Dizziness and giddiness: Secondary | ICD-10-CM | POA: Diagnosis not present

## 2021-02-06 MED ORDER — MECLIZINE HCL 12.5 MG PO TABS: 12.5000 mg | ORAL_TABLET | Freq: Three times a day (TID) | ORAL | 0 refills | Status: DC | PRN

## 2021-02-06 NOTE — ED Provider Notes (Signed)
[facility]    CSN: [id number] Arrival date & time: 02/06/21  1210      History   Chief Complaint Chief Complaint  Patient presents with   Dizziness    HPI Sonya Lin is a 55 y.o. female.   Pt states that she gets intermit dizziness with movement when stressed at work. No loc, no facial droop. No confusion. Pt denies any headache. Pt states that in the past she rest and it is better. Has not  taken anything pta except her daily meds. Denies any illness eating and drinking ok.     Past Medical History:  Diagnosis Date   Hypertension    Hyperthyroidism     Patient Active Problem List   Diagnosis Date Noted   Graves disease 12/18/2020   Shoulder pain, left 12/18/2020   Hypertension 12/18/2020   Contraception management 03/20/2012    Past Surgical History:  Procedure Laterality Date   BREAST SURGERY     CERVIX LESION DESTRUCTION     HYSTEROSCOPY  03/20/2012   Procedure: HYSTEROSCOPY;  Surgeon: Cheri Fowler, MD;  Location: [facility];  Service: Gynecology;  Laterality: N/A;   IUD REMOVAL  03/20/2012   Procedure: INTRAUTERINE DEVICE (IUD) REMOVAL;  Surgeon: Cheri Fowler, MD;  Location: [facility];  Service: Gynecology;  Laterality: N/A;  Attempted Removal of Portion Intra-Uterine Device   LAPAROSCOPIC TUBAL LIGATION  03/20/2012   Procedure: LAPAROSCOPIC TUBAL LIGATION;  Surgeon: Cheri Fowler, MD;  Location: [facility];  Service: Gynecology;  Laterality: Bilateral;    OB History   No obstetric history on file.      Home Medications    Prior to Admission medications   Medication Sig Start Date End Date Taking? Authorizing Provider  meclizine (ANTIVERT) 12.5 MG tablet Take 1 tablet (12.5 mg total) by mouth 3 (three) times daily as needed for dizziness. 02/06/21  Yes Marney Setting, NP  amLODipine (NORVASC) 5 MG tablet Take 1 tablet (5 mg total) by mouth daily. 12/16/20   [name], DO  Cholecalciferol (VITAMIN D) 2000 UNITS tablet Take 2,000  Units by mouth daily.    [provider]  diclofenac (VOLTAREN) 75 MG EC tablet Take 1 tablet (75 mg total) by mouth 2 (two) times daily. 12/28/20   Dene Gentry, MD    Family History Family History  Problem Relation Age of Onset   Healthy Mother    Stroke Father    Hypertension Father    Diabetes Father     Social History Social History   Tobacco Use   Smoking status: Never   Smokeless tobacco: Never  Vaping Use   Vaping Use: Never used  Substance Use Topics   Alcohol use: No   Drug use: No     Allergies   Penicillins, Erythromycin, Erythromycin, and Penicillins   Review of Systems Review of Systems  Constitutional:  Negative for fatigue and fever.  Respiratory: Negative.    Cardiovascular: Negative.   Gastrointestinal: Negative.   Genitourinary: Negative.   Neurological:  Positive for dizziness. Negative for syncope, weakness, numbness and headaches.    Physical Exam Triage Vital Signs ED Triage Vitals  Enc Vitals Group     BP 02/06/21 1222 (!) 156/82     Pulse Rate 02/06/21 1222 60     Resp 02/06/21 1222 18     Temp 02/06/21 1222 98.4 F (36.9 C)     Temp src --      SpO2 02/06/21 1222 98 %  Weight --      Height --      Head Circumference --      Peak Flow --      Pain Score 02/06/21 1223 5     Pain Loc --      Pain Edu? --      Excl. in [place]? --    No data found.  Updated Vital Signs BP (!) 156/82 (BP Location: Left Arm)   Pulse 60   Temp 98.4 F (36.9 C)   Resp 18   SpO2 98%   Visual Acuity Right Eye Distance:   Left Eye Distance:   Bilateral Distance:    Right Eye Near:   Left Eye Near:    Bilateral Near:     Physical Exam Constitutional:      General: She is not in acute distress.    Appearance: Normal appearance. She is not ill-appearing.  HENT:     Right Ear: Tympanic membrane normal.     Left Ear: Tympanic membrane normal.  Cardiovascular:     Rate and Rhythm: Normal rate.  Pulmonary:     Effort:  Pulmonary effort is normal.  Abdominal:     General: Abdomen is flat.  Skin:    Capillary Refill: Capillary refill takes less than 2 seconds.  Neurological:     General: No focal deficit present.     Mental Status: She is alert. Mental status is at baseline.     Sensory: No sensory deficit.     Motor: No weakness.     UC Treatments / Results  Labs (all labs ordered are listed, but only abnormal results are displayed) Labs Reviewed - No data to display  EKG   Radiology No results found.  Procedures Procedures (including critical care time)  Medications Ordered in UC Medications - No data to display  Initial Impression / Assessment and Plan / UC Course  I have reviewed the triage vital signs and the nursing notes.  Pertinent labs & imaging results that were available during my care of the patient were reviewed by me and considered in my medical decision making (see chart for details).     Stay hydrated well  Will give Antivert to take as needed Monitor bp  If symptoms become worse or you have weakness to one side of the body gf to ER  Final Clinical Impressions(s) / UC Diagnoses   Final diagnoses:  Dizziness and giddiness     Discharge Instructions      Stay hydrated well  Will give Antivert to take as needed Monitor bp  If symptoms become worse or you have weakness to one side of the body gf to ER      ED Prescriptions     Medication Sig Dispense Auth. Provider   meclizine (ANTIVERT) 12.5 MG tablet Take 1 tablet (12.5 mg total) by mouth 3 (three) times daily as needed for dizziness. 30 tablet Marney Setting, NP      PDMP not reviewed this encounter.   Marney Setting, NP 02/06/21 1241

## 2021-02-06 NOTE — Discharge Instructions (Addendum)
Stay hydrated well  Will give Antivert to take as needed Monitor bp  If symptoms become worse or you have weakness to one side of the body gf to ER

## 2021-02-06 NOTE — ED Triage Notes (Signed)
Patient presents to [facility] for evaluation of dizziness x 3 days, pretty much constant (not just with position changes).  Denies nausea or vomiting.  States she takes Amlodipine '5mg'$ .  Was concerned her BP may be high.  156/82 at triage

## 2021-02-08 ENCOUNTER — Other Ambulatory Visit: Payer: Self-pay

## 2021-02-08 ENCOUNTER — Ambulatory Visit: Payer: BLUE CROSS/BLUE SHIELD | Admitting: Family Medicine

## 2021-02-08 ENCOUNTER — Encounter: Payer: Self-pay | Admitting: Family Medicine

## 2021-02-08 VITALS — Ht <= 58 in | Wt 125.0 lb

## 2021-02-08 DIAGNOSIS — G8929 Other chronic pain: Secondary | ICD-10-CM | POA: Diagnosis not present

## 2021-02-08 DIAGNOSIS — M25512 Pain in left shoulder: Secondary | ICD-10-CM

## 2021-02-08 NOTE — Patient Instructions (Signed)
Your pain is due to [facility] arthritis. Continue the diclofenac once a day as you have been but you can try the topical version of diclofenac (it's over the counter, apply to the area up to 4 times a day) to see if this helps at this point. Ok to take tylenol with this. If not improving and you want to go ahead with the injection we talked about, give me a call. Otherwise if you continue to improve follow up with me as needed.

## 2021-02-08 NOTE — Progress Notes (Signed)
PCP: [name], DO  Subjective:   HPI: Patient is a 55 y.o. female here for left shoulder pain.  8/3: Patient reports since December she's had superior left shoulder pain. 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 Pain worse with this reaching. Tried mobic without much benefit. Tylenol does help some. Topicals have not helped. Worse in the morning also. Went to [place] and recalls an anterior shoulder injection which did not help and actually made pain worse. No prior issues.  9/14: Patient reports her left shoulder is slowly improving. Takes diclofenac once a day at nighttime - concerned about taking it more than this. 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 Worse with overhead motions. Feels more stiff in the morning. Pain in superior shoulder.  Past Medical History:  Diagnosis Date   Hypertension    Hyperthyroidism     Current Outpatient Medications on File Prior to Visit  Medication Sig Dispense Refill   amLODipine (NORVASC) 5 MG tablet Take 1 tablet (5 mg total) by mouth daily. 30 tablet 3   Cholecalciferol (VITAMIN D) 2000 UNITS tablet Take 2,000 Units by mouth daily.     diclofenac (VOLTAREN) 75 MG EC tablet Take 1 tablet (75 mg total) by mouth 2 (two) times daily. 60 tablet 1   meclizine (ANTIVERT) 12.5 MG tablet Take 1 tablet (12.5 mg total) by mouth 3 (three) times daily as needed for dizziness. 30 tablet 0   No current facility-administered medications on file prior to visit.    Past Surgical History:  Procedure Laterality Date   BREAST SURGERY     CERVIX LESION DESTRUCTION     HYSTEROSCOPY  03/20/2012   Procedure: HYSTEROSCOPY;  Surgeon: Cheri Fowler, MD;  Location: [facility];  Service: Gynecology;  Laterality: N/A;   IUD REMOVAL  03/20/2012   Procedure: INTRAUTERINE DEVICE (IUD) REMOVAL;  Surgeon: Cheri Fowler, MD;  Location: [facility];  Service:  Gynecology;  Laterality: N/A;  Attempted Removal of Portion Intra-Uterine Device   LAPAROSCOPIC TUBAL LIGATION  03/20/2012   Procedure: LAPAROSCOPIC TUBAL LIGATION;  Surgeon: Cheri Fowler, MD;  Location: [facility];  Service: Gynecology;  Laterality: Bilateral;    Allergies  Allergen Reactions   Penicillins Hives    Rash; whelps   Erythromycin Rash    Rash; GI upset   Erythromycin Rash   Penicillins Rash    Social History   Socioeconomic History   Marital status: Married    Spouse name: Not on file   Number of children: Not on file   Years of education: Not on file   Highest education level: Not on file  Occupational History   Not on file  Tobacco Use   Smoking status: Never   Smokeless tobacco: Never  Vaping Use   Vaping Use: Never used  Substance and Sexual Activity   Alcohol use: No   Drug use: No   Sexual activity: Not on file  Other Topics Concern   Not on file  Social History Narrative   ** Merged History Encounter **       Social Determinants of Health   Financial Resource Strain: Not on file  Food Insecurity: Not on file  Transportation Needs: Not on file  Physical Activity: Not on file  Stress: Not on file  Social Connections: Not on file  Intimate Partner Violence: Not on file    Family History  Problem  Relation Age of Onset   Healthy Mother    Stroke Father    Hypertension Father    Diabetes Father     Ht '4\' 7"'$  (1.397 m)   Wt 125 lb (56.7 kg)   BMI 29.05 kg/m   Sports [place] Adult Exercise 02/08/2021  Frequency of aerobic exercise (# of days/week) 0  Average time in minutes 0  Frequency of strengthening activities (# of days/week) 0    No flowsheet data found.  Review of Systems: See HPI above.     Objective:  Physical Exam:  Gen: NAD, comfortable in exam room  Left shoulder: No swelling, ecchymoses.  No gross deformity. Mild TTP AC joint. FROM. Negative Hawkins, Neers. Negative Yergasons. Strength 5/5 with empty  can and resisted internal/external rotation. Mild pain crossover adduction. NV intact distally.  Assessment & Plan:  1. Left shoulder pain - 2/2 AC arthritis.  Clinically her subacromial bursitis has resolved.  Continue diclofenac but consider change to topical gel.  Consider AC injection if not continuing to improve.  F/u prn.

## 2021-03-01 ENCOUNTER — Ambulatory Visit (INDEPENDENT_AMBULATORY_CARE_PROVIDER_SITE_OTHER): Payer: BLUE CROSS/BLUE SHIELD | Admitting: Endocrinology

## 2021-03-01 ENCOUNTER — Ambulatory Visit
Admission: RE | Admit: 2021-03-01 | Discharge: 2021-03-01 | Disposition: A | Discharge status: Home / Self Care | Payer: BLUE CROSS/BLUE SHIELD | Source: Ambulatory Visit | Attending: Family Medicine | Admitting: Family Medicine

## 2021-03-01 ENCOUNTER — Other Ambulatory Visit: Payer: Self-pay

## 2021-03-01 VITALS — BP 150/80 | HR 73 | Ht <= 58 in | Wt 126.8 lb

## 2021-03-01 DIAGNOSIS — E0500 Thyrotoxicosis with diffuse goiter without thyrotoxic crisis or storm: Secondary | ICD-10-CM | POA: Diagnosis not present

## 2021-03-01 DIAGNOSIS — Z1231 Encounter for screening mammogram for malignant neoplasm of breast: Secondary | ICD-10-CM

## 2021-03-01 NOTE — Progress Notes (Signed)
Subjective:    Patient ID: Sonya Lin, female    DOB: 09-24-1965, 55 y.o.   MRN: [id number]  HPI Pt is referred by Dr McDiarmid, for hyperthyroidism.  Pt reports he was dx'ed with hyperthyroidism in 2010.  She took tapazole 2010-2019.  she has never had XRT to the anterior neck, or thyroid surgery.  she does not consume kelp or any other non-prescribed thyroid medication.  she has never been on amiodarone.  She has been off rx since 2019.  pt states she feels well in general.  Past Medical History:  Diagnosis Date   Hypertension    Hyperthyroidism     Past Surgical History:  Procedure Laterality Date   BREAST SURGERY     CERVIX LESION DESTRUCTION     HYSTEROSCOPY  03/20/2012   Procedure: HYSTEROSCOPY;  Surgeon: Cheri Fowler, MD;  Location: [facility];  Service: Gynecology;  Laterality: N/A;   IUD REMOVAL  03/20/2012   Procedure: INTRAUTERINE DEVICE (IUD) REMOVAL;  Surgeon: Cheri Fowler, MD;  Location: [facility];  Service: Gynecology;  Laterality: N/A;  Attempted Removal of Portion Intra-Uterine Device   LAPAROSCOPIC TUBAL LIGATION  03/20/2012   Procedure: LAPAROSCOPIC TUBAL LIGATION;  Surgeon: Cheri Fowler, MD;  Location: [facility];  Service: Gynecology;  Laterality: Bilateral;    Social History   Socioeconomic History   Marital status: Married    Spouse name: Not on file   Number of children: Not on file   Years of education: Not on file   Highest education level: Not on file  Occupational History   Not on file  Tobacco Use   Smoking status: Never   Smokeless tobacco: Never  Vaping Use   Vaping Use: Never used  Substance and Sexual Activity   Alcohol use: No   Drug use: No   Sexual activity: Not on file  Other Topics Concern   Not on file  Social History Narrative   ** Merged History Encounter **       Social Determinants of Health   Financial Resource Strain: Not on file  Food Insecurity: Not on file  Transportation Needs: Not on file  Physical Activity:  Not on file  Stress: Not on file  Social Connections: Not on file  Intimate Partner Violence: Not on file    Current Outpatient Medications on File Prior to Visit  Medication Sig Dispense Refill   amLODipine (NORVASC) 5 MG tablet Take 1 tablet (5 mg total) by mouth daily. 30 tablet 3   Cholecalciferol (VITAMIN D) 2000 UNITS tablet Take 2,000 Units by mouth daily.     diclofenac (VOLTAREN) 75 MG EC tablet Take 1 tablet (75 mg total) by mouth 2 (two) times daily. 60 tablet 1   meclizine (ANTIVERT) 12.5 MG tablet Take 1 tablet (12.5 mg total) by mouth 3 (three) times daily as needed for dizziness. 30 tablet 0   No current facility-administered medications on file prior to visit.    Allergies  Allergen Reactions   Penicillins Hives    Rash; whelps   Erythromycin Rash    Rash; GI upset   Erythromycin Rash   Penicillins Rash    Family History  Problem Relation Age of Onset   Healthy Mother    Stroke Father    Hypertension Father    Diabetes Father     BP (!) 150/80 (BP Location: Right Arm, Patient Position: Sitting, Cuff Size: Normal)   Pulse 73   Ht 4\' 7"  (1.397 m)   Wt 126 lb  12.8 oz (57.5 kg)   LMP 02/13/2021   SpO2 97%   BMI 29.47 kg/m    Review of Systems Denies palpitations, doe, and tremor.  She attributes sweating to work and menopause.      Objective:   Physical Exam VS: see vs page GEN: no distress HEAD: head: no deformity eyes: no periorbital swelling, no proptosis external nose and ears are normal NECK: supple, thyroid is slightly enlarged, with irreg surface, but no palpable nodule.   CHEST WALL: no deformity LUNGS: clear to auscultation CV: reg rate and rhythm, no murmur.  MUSCULOSKELETAL: gait is normal and steady.   EXTEMITIES: no deformity.  no leg edema.   NEURO:  readily moves all 4's.  sensation is intact to touch on all 4's.  No tremor.  SKIN:  Normal texture and temperature.  No rash or suspicious lesion is visible.  Not diaphoretic.   NODES:  None palpable at the neck PSYCH: alert, well-oriented.  Does not appear anxious nor depressed.   Lab Results  Component Value Date   TSH 1.200 12/16/2020   Nuc med scan (2011): Diffuse enlargement of the gland without focal lesions.  2.  Elevated radioactive iodine uptake.  I have reviewed outside records, and summarized: Pt was noted to have hyperthyroidism, and referred here.  She has also been seen recently for lightheadedness, wellness, and shoulder pain     Assessment & Plan:  Hyperthyroidism.  Stable off rx, but she is at risk for recurrence.  No medication is needed for that now.  Patient Instructions  Your blood pressure is high today.  Please see your primary care provider soon, to have it rechecked With time, your thyroid will become abnormal again.  This could be either high or low. You should have a doctor examine your neck, and have the blood test checked at least once per year.  Either Dr Susa Simmonds or I would be happy to do that.

## 2021-03-01 NOTE — Patient Instructions (Addendum)
Your blood pressure is high today.  Please see your primary care provider soon, to have it rechecked With time, your thyroid will become abnormal again.  This could be either high or low. You should have a doctor examine your neck, and have the blood test checked at least once per year.  Either Dr Susa Simmonds or I would be happy to do that.

## 2021-03-05 ENCOUNTER — Encounter (HOSPITAL_COMMUNITY): Payer: Self-pay | Admitting: *Deleted

## 2021-03-05 ENCOUNTER — Ambulatory Visit (HOSPITAL_COMMUNITY)
Admission: EM | Admit: 2021-03-05 | Discharge: 2021-03-05 | Disposition: A | Discharge status: Home / Self Care | Payer: BLUE CROSS/BLUE SHIELD | Attending: Physician Assistant | Admitting: Physician Assistant

## 2021-03-05 ENCOUNTER — Other Ambulatory Visit: Payer: Self-pay

## 2021-03-05 DIAGNOSIS — I10 Essential (primary) hypertension: Secondary | ICD-10-CM

## 2021-03-05 DIAGNOSIS — G44209 Tension-type headache, unspecified, not intractable: Secondary | ICD-10-CM | POA: Diagnosis not present

## 2021-03-05 HISTORY — DX: Unspecified osteoarthritis, unspecified site: M19.90

## 2021-03-05 MED ORDER — AMLODIPINE BESYLATE 10 MG PO TABS: 10.0000 mg | ORAL_TABLET | Freq: Every day | ORAL | 0 refills | Status: DC

## 2021-03-05 NOTE — ED Triage Notes (Addendum)
Pt states "wasn't feeling well yesterday at work"; took BP last night = 157/102. This AM @ 0940 = 159/100, and 1055 = 170/103.  C/O slight HA.  States took her normal HTN med today @ 1030 and has been taking consistently. States was seen in another urgent care approx 1 month ago for same, but no adjustments were made to med at that time.

## 2021-03-05 NOTE — Discharge Instructions (Addendum)
Please increase your amlodipine as we discussed.  Continue monitoring your blood pressure at home and keep a log for evaluation of follow-up appointment.  As we discussed, if you have any worsening symptoms including worsening headache or develop new symptoms such as dizziness, chest pain, shortness of breath, leg swelling, vision changes you need to go to the emergency room.  Avoid caffeine, decongestants, salt.  I do think that the diclofenac could be contributing to your elevated blood pressure so please monitor this on the days you are not taking it and if you notice a big change we may need to consider different medication.  Please follow-up with your primary care provider within a week.

## 2021-03-05 NOTE — ED Provider Notes (Signed)
[facility]    CSN: [id number] Arrival date & time: 03/05/21  1131      History   Chief Complaint No chief complaint on file.   HPI Sonya Lin is a 55 y.o. female.   Patient presents today with a several day history of elevated blood pressure.  She has a history of hypertension is currently prescribed amlodipine 5 mg daily which she reports taking as prescribed without missing doses or noted side effects.  Yesterday she was feeling and had a mild headache so she took her blood pressure and noted this to be 170/103.  It has persistently been elevated though not as high prompting evaluation today.  She continues to have a mild headache and rates this a 3 on a 0-10 pain scale, localized to frontal region, described as mild pressure, no aggravating or alleviating factors identified.  She denies any vision changes, focal weakness, dysarthria, nausea, vomiting, worsening headache.  She denies any chest pain, shortness of breath, leg swelling.  She denies any recent increase in salt consumption, decongestant use, medication changes.  She is currently taking chronic NSAIDs (diclofenac 75 mg) but has been on this medication for arthritis for approximately 1 month.     Past Medical History:  Diagnosis Date   Hypertension    Hyperthyroidism    Osteoarthritis     Patient Active Problem List   Diagnosis Date Noted   Graves disease 12/18/2020   Shoulder pain, left 12/18/2020   Hypertension 12/18/2020   Contraception management 03/20/2012    Past Surgical History:  Procedure Laterality Date   BREAST SURGERY     CERVIX LESION DESTRUCTION     HYSTEROSCOPY  03/20/2012   Procedure: HYSTEROSCOPY;  Surgeon: Cheri Fowler, MD;  Location: [facility];  Service: Gynecology;  Laterality: N/A;   IUD REMOVAL  03/20/2012   Procedure: INTRAUTERINE DEVICE (IUD) REMOVAL;  Surgeon: Cheri Fowler, MD;  Location: [facility];  Service: Gynecology;  Laterality: N/A;  Attempted Removal of Portion  Intra-Uterine Device   LAPAROSCOPIC TUBAL LIGATION  03/20/2012   Procedure: LAPAROSCOPIC TUBAL LIGATION;  Surgeon: Cheri Fowler, MD;  Location: [facility];  Service: Gynecology;  Laterality: Bilateral;    OB History   No obstetric history on file.      Home Medications    Prior to Admission medications   Medication Sig Start Date End Date Taking? Authorizing Provider  diclofenac (VOLTAREN) 75 MG EC tablet Take 1 tablet (75 mg total) by mouth 2 (two) times daily. 12/28/20  Yes Hudnall, Sharyn Lull, MD  amLODipine (NORVASC) 10 MG tablet Take 1 tablet (10 mg total) by mouth daily. 03/05/21   Anona Giovannini, Derry Skill, PA-C    Family History Family History  Problem Relation Age of Onset   Healthy Mother    Stroke Father    Hypertension Father    Diabetes Father     Social History Social History   Tobacco Use   Smoking status: Never   Smokeless tobacco: Never  Vaping Use   Vaping Use: Never used  Substance Use Topics   Alcohol use: No   Drug use: No     Allergies   Penicillins, Erythromycin, Erythromycin, and Penicillins   Review of Systems Review of Systems  Constitutional:  Positive for activity change. Negative for appetite change, fatigue and fever.  Eyes:  Negative for photophobia and visual disturbance.  Respiratory:  Negative for cough and shortness of breath.   Cardiovascular:  Negative for chest pain.  Gastrointestinal:  Negative  for abdominal pain, diarrhea, nausea and vomiting.  Neurological:  Positive for headaches. Negative for dizziness, syncope, facial asymmetry, speech difficulty, weakness, light-headedness and numbness.    Physical Exam Triage Vital Signs ED Triage Vitals  Enc Vitals Group     BP 03/05/21 1333 (!) 170/77     Pulse Rate 03/05/21 1333 70     Resp 03/05/21 1333 18     Temp 03/05/21 1333 97.9 F (36.6 C)     Temp Source 03/05/21 1333 Oral     SpO2 03/05/21 1333 97 %     Weight --      Height --      Head Circumference --      Peak Flow --       Pain Score 03/05/21 1335 4     Pain Loc --      Pain Edu? --      Excl. in [place]? --    No data found.  Updated Vital Signs BP (!) 150/89   Pulse 68   Temp 97.9 F (36.6 C) (Oral)   Resp 18   LMP 02/13/2021 (Approximate)   SpO2 98%   Visual Acuity Right Eye Distance:   Left Eye Distance:   Bilateral Distance:    Right Eye Near:   Left Eye Near:    Bilateral Near:     Physical Exam Vitals reviewed.  Constitutional:      General: She is awake. She is not in acute distress.    Appearance: Normal appearance. She is well-developed. She is not ill-appearing.     Comments: Very pleasant female appears stated age no acute distress sitting comfortably in exam room  HENT:     Head: Normocephalic and atraumatic.     Right Ear: External ear normal.     Left Ear: External ear normal.     Mouth/Throat:     Tongue: Tongue does not deviate from midline.     Pharynx: Uvula midline. No oropharyngeal exudate or posterior oropharyngeal erythema.  Eyes:     Extraocular Movements: Extraocular movements intact.     Pupils: Pupils are equal, round, and reactive to light.  Cardiovascular:     Rate and Rhythm: Normal rate and regular rhythm.     Heart sounds: Normal heart sounds, S1 normal and S2 normal. No murmur heard. Pulmonary:     Effort: Pulmonary effort is normal.     Breath sounds: Normal breath sounds. No wheezing, rhonchi or rales.     Comments: Clear to auscultation bilaterally Musculoskeletal:     Right lower leg: No edema.     Left lower leg: No edema.     Comments: Strength 5/5 bilateral upper and lower extremities.  Lymphadenopathy:     Head:     Right side of head: No submental, submandibular or tonsillar adenopathy.     Left side of head: No submental, submandibular or tonsillar adenopathy.  Neurological:     General: No focal deficit present.     Cranial Nerves: Cranial nerves are intact.     Motor: Motor function is intact.     Comments: Cranial nerves II through  XII intact.  Psychiatric:        Behavior: Behavior is cooperative.     UC Treatments / Results  Labs (all labs ordered are listed, but only abnormal results are displayed) Labs Reviewed - No data to display  EKG   Radiology No results found.  Procedures Procedures (including critical care time)  Medications Ordered in  UC Medications - No data to display  Initial Impression / Assessment and Plan / UC Course  I have reviewed the triage vital signs and the nursing notes.  Pertinent labs & imaging results that were available during my care of the patient were reviewed by me and considered in my medical decision making (see chart for details).     Discussed that given mild headache in the setting of elevated blood pressure this evening to be to the emergency room for CT scan.  Patient reports headache is improving and is only mild so declined emergent evaluation today.  Neurological exam was normal today.  Discussed that chronic NSAID use of diclofenac could be contributing to elevated blood pressure and encouraged her to monitor her blood pressure more closely on days that she does not take this medicine to see if it improves.  She was encouraged to avoid decongestants, salt, caffeine.  She is to continue monitoring her blood pressure at home and keep a log for evaluation of follow-up appointment.  Given elevated readings will increase amlodipine to 10 mg daily.  Discussed at length that if she has any worsening symptoms including chest pain, shortness of breath, headache, dizziness, vision changes she needs to go to the emergency room.  She is to follow-up with her primary care provider within a week.  Strict return precautions given to which she expressed understanding.   Final Clinical Impressions(s) / UC Diagnoses   Final diagnoses:  Primary hypertension  Elevated blood pressure reading in office with diagnosis of hypertension  Tension headache     Discharge Instructions       Please increase your amlodipine as we discussed.  Continue monitoring your blood pressure at home and keep a log for evaluation of follow-up appointment.  As we discussed, if you have any worsening symptoms including worsening headache or develop new symptoms such as dizziness, chest pain, shortness of breath, leg swelling, vision changes you need to go to the emergency room.  Avoid caffeine, decongestants, salt.  I do think that the diclofenac could be contributing to your elevated blood pressure so please monitor this on the days you are not taking it and if you notice a big change we may need to consider different medication.  Please follow-up with your primary care provider within a week.     ED Prescriptions     Medication Sig Dispense Auth. Provider   amLODipine (NORVASC) 10 MG tablet Take 1 tablet (10 mg total) by mouth daily. 30 tablet Hana Trippett, Derry Skill, PA-C      PDMP not reviewed this encounter.   Terrilee Croak, PA-C 03/05/21 1451

## 2021-03-07 ENCOUNTER — Other Ambulatory Visit: Payer: Self-pay | Admitting: Family Medicine

## 2021-03-07 DIAGNOSIS — R928 Other abnormal and inconclusive findings on diagnostic imaging of breast: Secondary | ICD-10-CM

## 2021-03-10 ENCOUNTER — Ambulatory Visit
Admission: RE | Admit: 2021-03-10 | Discharge: 2021-03-10 | Disposition: A | Discharge status: Home / Self Care | Payer: BLUE CROSS/BLUE SHIELD | Source: Ambulatory Visit | Attending: Family Medicine | Admitting: Family Medicine

## 2021-03-10 ENCOUNTER — Other Ambulatory Visit: Payer: Self-pay

## 2021-03-10 DIAGNOSIS — R928 Other abnormal and inconclusive findings on diagnostic imaging of breast: Secondary | ICD-10-CM

## 2021-03-29 ENCOUNTER — Ambulatory Visit (INDEPENDENT_AMBULATORY_CARE_PROVIDER_SITE_OTHER): Payer: BLUE CROSS/BLUE SHIELD | Admitting: Family Medicine

## 2021-03-29 ENCOUNTER — Encounter: Payer: Self-pay | Admitting: Family Medicine

## 2021-03-29 ENCOUNTER — Other Ambulatory Visit: Payer: Self-pay

## 2021-03-29 VITALS — BP 136/83 | HR 75 | Ht <= 58 in | Wt 130.0 lb

## 2021-03-29 DIAGNOSIS — S66911A Strain of unspecified muscle, fascia and tendon at wrist and hand level, right hand, initial encounter: Secondary | ICD-10-CM

## 2021-03-29 DIAGNOSIS — M25531 Pain in right wrist: Secondary | ICD-10-CM | POA: Diagnosis not present

## 2021-03-29 DIAGNOSIS — I10 Essential (primary) hypertension: Secondary | ICD-10-CM | POA: Diagnosis not present

## 2021-03-29 NOTE — Progress Notes (Signed)
   SUBJECTIVE:   CHIEF COMPLAINT / HPI:   Chief Complaint  Patient presents with   Right arm pain   Hypertension     Sonya Lin is a 55 y.o. female here for blood pressure follow-up.  Patient seen in the urgent care for systolic blood pressures 127N to 170s.  States that during this time she had a dull headache when she woke up.  Reports nausea but no vomiting.  Takes amlodipine regularly.  Has been taking Voltaren prescribed for her arm pain.  Denies vision changes, leg swelling, chest pain, shortness of breath.  No current headache.  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  Has been having persistent right wrist pain.  Taking the Voltaren tablets for pain continues to have pain.  Recently took Mobic for pain.  She has a soft wrist brace.  Has not been dropping objects or having weakness/numbness or tingling in her wrist or forearm.    PERTINENT  PMH / PSH: reviewed and updated as appropriate   OBJECTIVE:   BP 136/83   Pulse 75   Ht 4\' 7"  (1.397 m)   Wt 130 lb (59 kg)   SpO2 100%   BMI 30.21 kg/m    GEN: pleasant well appearing female, in no acute distress  CV: regular rate and rhythm, no murmurs appreciated  RESP: no increased work of breathing, clear to ascultation bilaterally MSK: Right wrist pain, no TTP. Inspection yielded no erythema, ecchymosis, bony deformity, or swelling. ROM full with good flexion and extension and ulnar/radial deviation that is symmetrical with opposite wrist. Palpation is normal over metacarpals, scaphoid and lunate; tendons without tenderness/swelling. Strength 4+/5 in all directions without pain. Negative Finkelstein, tinel's and phalens. SKIN: warm, dry,    ASSESSMENT/PLAN:   Hypertension BP 136/83, at goal.  Continue amlodipine 5 mg daily.  Reports taking this medication regularly.  This back pain was the cause of her elevated blood pressures. -Obtain BMP  Right wrist  pain Suspect overuse syndrome.  DME order placed for right wrist brace with thumb spica.  Patient to pick up at sports medicine clinic or someone's pharmacy.  Continue Voltaren tablets as needed.  No refill requested.  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  Declined light duty work restriction at this time     [name], [place] PGY-3, [facility] 03/29/2021

## 2021-03-29 NOTE — Patient Instructions (Signed)
It was great seeing you today! Continue taking Voltaren and Tylenol as needed for your shoulder pain. Consider light duty at your job to allow your joints to rest.    For your right wrist, wear wrist splint 24/7 for the next week. Remove for showering and activies for water.   As discusssed, I do not recommend changing your blood pressure medications. If your blood pressure gets >180/>110 seek urgent medical care.  Your blood pressure was normal in the office today.  I believe pain plays a large factor with your blood pressure readings.     Take care,   [place]

## 2021-03-30 ENCOUNTER — Other Ambulatory Visit: Payer: Self-pay

## 2021-03-30 DIAGNOSIS — I10 Essential (primary) hypertension: Secondary | ICD-10-CM

## 2021-03-30 MED ORDER — AMLODIPINE BESYLATE 10 MG PO TABS: 10.0000 mg | ORAL_TABLET | Freq: Every day | ORAL | 2 refills | Status: DC

## 2021-04-08 DIAGNOSIS — M25531 Pain in right wrist: Secondary | ICD-10-CM | POA: Insufficient documentation

## 2021-04-08 NOTE — Assessment & Plan Note (Signed)
BP 136/83, at goal.  Continue amlodipine 5 mg daily.  Reports taking this medication regularly.  This back pain was the cause of her elevated blood pressures. -Obtain BMP

## 2021-04-08 NOTE — Assessment & Plan Note (Signed)
Suspect overuse syndrome.  DME order placed for right wrist brace with thumb spica.  Patient to pick up at sports medicine clinic or someone's pharmacy.  Continue Voltaren tablets as needed.  No refill requested.  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  Declined light duty work restriction at this time

## 2021-05-17 ENCOUNTER — Ambulatory Visit: Payer: BLUE CROSS/BLUE SHIELD | Admitting: Family Medicine

## 2021-05-19 ENCOUNTER — Encounter: Payer: Self-pay | Admitting: Family Medicine

## 2021-05-19 ENCOUNTER — Other Ambulatory Visit: Payer: Self-pay

## 2021-05-19 ENCOUNTER — Ambulatory Visit: Payer: BLUE CROSS/BLUE SHIELD | Admitting: Family Medicine

## 2021-05-19 VITALS — BP 134/86 | HR 69 | Temp 98.6°F | Wt 127.4 lb

## 2021-05-19 DIAGNOSIS — R059 Cough, unspecified: Secondary | ICD-10-CM | POA: Diagnosis not present

## 2021-05-19 DIAGNOSIS — J069 Acute upper respiratory infection, unspecified: Secondary | ICD-10-CM | POA: Diagnosis not present

## 2021-05-19 LAB — POCT INFLUENZA A/B
Influenza A, POC: NEGATIVE
Influenza B, POC: NEGATIVE

## 2021-05-19 NOTE — Patient Instructions (Addendum)
Thank you for coming to see me today. It was a pleasure.   We will get some labs today.  If they are abnormal or we need to do something about them, I will call you.  If they are normal, I will send you a message on MyChart (if it is active) or a letter in the mail.  If you don't hear from [place] in 2 weeks, please call the office at the number below.    Please follow-up with PCP as needed  If you have any questions or concerns, please do not hesitate to call the office at [phone].  Best,   Carollee Leitz, MD    Fatigue If you have fatigue, you feel tired all the time and have a lack of energy or a lack of motivation. Fatigue may make it difficult to start or complete tasks because of exhaustion. In general, occasional or mild fatigue is often a normal response to activity or life. However, long-lasting (chronic) or extreme fatigue may be a symptom of a medical condition. Follow these instructions at home: General instructions Watch your fatigue for any changes. Go to bed and get up at the same time every day. Avoid fatigue by pacing yourself during the day and getting enough sleep at night. Maintain a healthy weight. Medicines Take over-the-counter and prescription medicines only as told by your health care provider. Take a multivitamin, if told by your health care provider.  Do not use herbal or dietary supplements unless they are approved by your health care provider. Activity  Exercise regularly, as told by your health care provider. Use or practice techniques to help you relax, such as yoga, tai chi, meditation, or massage therapy. Eating and drinking  Avoid heavy meals in the evening. Eat a well-balanced diet, which includes lean proteins, whole grains, plenty of fruits and vegetables, and low-fat dairy products. Avoid consuming too much caffeine. Avoid the use of alcohol. Drink enough fluid to keep your urine pale yellow. Lifestyle Change situations that cause you stress.  Try to keep your work and personal schedule in balance. Do not use any products that contain nicotine or tobacco, such as cigarettes and e-cigarettes. If you need help quitting, ask your health care provider. Do not use drugs. Contact a health care provider if: Your fatigue does not get better. You have a fever. You suddenly lose or gain weight. You have headaches. You have trouble falling asleep or sleeping through the night. You feel angry, guilty, anxious, or sad. You are unable to have a bowel movement (constipation). Your skin is dry. You have swelling in your legs or another part of your body. Get help right away if: You feel confused. Your vision is blurry. You feel faint or you pass out. You have a severe headache. You have severe pain in your abdomen, your back, or the area between your waist and hips (pelvis). You have chest pain, shortness of breath, or an irregular or fast heartbeat. You are unable to urinate, or you urinate less than normal. You have abnormal bleeding, such as bleeding from the rectum, vagina, nose, lungs, or nipples. You vomit blood. You have thoughts about hurting yourself or others. If you ever feel like you may hurt yourself or others, or have thoughts about taking your own life, get help right away. You can go to your nearest emergency department or call: Your local emergency services (911 in the [place] A suicide crisis helpline, such as the [facility] at 647-017-2000  or 988 in the [place] This is open 24 hours a day. Summary If you have fatigue, you feel tired all the time and have a lack of energy or a lack of motivation. Fatigue may make it difficult to start or complete tasks because of exhaustion. Long-lasting (chronic) or extreme fatigue may be a symptom of a medical condition. Exercise regularly, as told by your health care provider. Change situations that cause you stress. Try to keep your work and personal schedule  in balance. This information is not intended to replace advice given to you by your health care provider. Make sure you discuss any questions you have with your health care provider. Document Revised: 12/07/2020 Document Reviewed: 03/24/2020 Elsevier Patient Education  2022 [employer].

## 2021-05-19 NOTE — Progress Notes (Signed)
° ° °  SUBJECTIVE:    CHIEF COMPLAINT / HPI: headache, fever, body aches  3 days subjective fever, nasal congestion, fatigue, headaches and nausea.  Denies any cough, chest pain, shortness of breath, slurred speech, photophobia, weakness, numbness or tingling.  No recent sick contacts.  Reports working 2 jobs where some people don't wear mask.  Denies any diarrhea or urinary symptoms.  PERTINENT  PMH / PSH:  HTN  OBJECTIVE:   BP 134/86    Pulse 69    Temp 98.6 F (37 C)    Wt 127 lb 6 oz (57.8 kg)    SpO2 99%    BMI 29.60 kg/m    General: Alert, no acute distress Cardio: Normal S1 and S2, RRR, no r/m/g Pulm: CTAB, normal work of breathing Abdomen: Bowel sounds normal. Abdomen soft and non-tender.  Extremities: No peripheral edema.  Neuro: Cranial nerves grossly intact   ASSESSMENT/PLAN:   URI (upper respiratory infection) COVID/Flu A/B negative.  No fever today.  Lung exam benign. Likely viral etiology Symptom management Strict return precautions provided Follow up with PCP if continued fatigue     Carollee Leitz, MD [place]

## 2021-05-20 LAB — SARS-COV-2, NAA 2 DAY TAT

## 2021-05-20 LAB — NOVEL CORONAVIRUS, NAA: SARS-CoV-2, NAA: NOT DETECTED

## 2021-05-23 ENCOUNTER — Encounter: Payer: Self-pay | Admitting: Family Medicine

## 2021-05-23 DIAGNOSIS — J069 Acute upper respiratory infection, unspecified: Secondary | ICD-10-CM | POA: Insufficient documentation

## 2021-05-23 NOTE — Assessment & Plan Note (Signed)
COVID/Flu A/B negative.  No fever today.  Lung exam benign. Likely viral etiology Symptom management Strict return precautions provided Follow up with PCP if continued fatigue

## 2021-06-29 ENCOUNTER — Encounter: Payer: Self-pay | Admitting: Family Medicine

## 2021-07-03 ENCOUNTER — Other Ambulatory Visit: Payer: Self-pay | Admitting: Family Medicine

## 2021-07-03 DIAGNOSIS — I10 Essential (primary) hypertension: Secondary | ICD-10-CM

## 2021-10-02 ENCOUNTER — Encounter: Payer: Self-pay | Admitting: Family Medicine

## 2021-10-02 ENCOUNTER — Ambulatory Visit (INDEPENDENT_AMBULATORY_CARE_PROVIDER_SITE_OTHER): Payer: BLUE CROSS/BLUE SHIELD | Admitting: Family Medicine

## 2021-10-02 VITALS — BP 145/90 | HR 73 | Ht <= 58 in | Wt 131.6 lb

## 2021-10-02 DIAGNOSIS — E0500 Thyrotoxicosis with diffuse goiter without thyrotoxic crisis or storm: Secondary | ICD-10-CM | POA: Diagnosis not present

## 2021-10-02 DIAGNOSIS — R1084 Generalized abdominal pain: Secondary | ICD-10-CM | POA: Diagnosis not present

## 2021-10-02 DIAGNOSIS — N926 Irregular menstruation, unspecified: Secondary | ICD-10-CM

## 2021-10-02 DIAGNOSIS — I10 Essential (primary) hypertension: Secondary | ICD-10-CM

## 2021-10-02 DIAGNOSIS — Z1159 Encounter for screening for other viral diseases: Secondary | ICD-10-CM

## 2021-10-02 DIAGNOSIS — N939 Abnormal uterine and vaginal bleeding, unspecified: Secondary | ICD-10-CM

## 2021-10-02 MED ORDER — AMLODIPINE BESYLATE 10 MG PO TABS: ORAL_TABLET | ORAL | 1 refills | Status: DC

## 2021-10-02 NOTE — Patient Instructions (Signed)
It was great seeing you today! ? ?Please check-out at the front desk before leaving the clinic. I'd like to see you back in 2 weeks but if you need to be seen earlier than that for any new issues we're happy to fit you in, just give [place] a call! ? ?Visit Remembers: ?- Stop by the pharmacy to pick up your prescriptions  ?- Continue to work on your healthy eating habits and incorporating exercise into your daily life.  ?- Your goal is to have an BP < 130/ 80 ?- Medicine Changes: none today  ? ?Regarding lab work today:  ?Due to recent changes in healthcare laws, you may see the results of your imaging and laboratory studies on MyChart before your provider has had a chance to review them.  I understand that in some cases there may be results that are confusing or concerning to you. Not all laboratory results come back in the same time frame and you may be waiting for multiple results in order to interpret others.  Please give [place] 72 hours in order for your provider to thoroughly review all the results before contacting the office for clarification of your results. If everything is normal, you will get a letter in the mail or a message in My Chart. Please give [place] a call if you do not hear from [place] after 2 weeks. ? ?Please bring all of your medications with you to each visit.  ? ?Feel free to call with any questions or concerns at any time, at [phone]. ?  ?Take care,  ?Dr. Susa Simmonds ?Sonya Lin  ?

## 2021-10-02 NOTE — Progress Notes (Addendum)
? ?  SUBJECTIVE:  ? ?CHIEF COMPLAINT / HPI:  ? ? ?Sonya Lin is a 56 y.o. female here for abdominal pain and blood pressure follow-up. ? ?Pt reports she has been under a lot of stress with her daughter.  Her blood pressures have been more elevated since situation with her daughter school situation.  She has been having intermittent headache.  No swelling in her legs.  Home blood pressures range from 128-148 / 93-86.  She has been taking her amlodipine regularly. ? ?Patient reports she is still menstruating.  Her mom menstruated until she was 34. Last had a period last week and the period before that was in December.  ? ?She has been having some abdominal pain.  Worse when she lays on her belly.  Has some nausea but no vomiting.  Denies diarrhea.  Intermittently has a bowel movement.  Typically can be constipated.  Denies dysuria and back pain.  States she cannot be pregnant as she had a tubal. ? ?PERTINENT  PMH / PSH: reviewed and updated as appropriate  ? ?OBJECTIVE:  ? ?BP (!) 145/90   Pulse 73   Ht '4\' 7"'$  (1.397 m)   Wt 131 lb 9.6 oz (59.7 kg)   SpO2 100%   BMI 30.59 kg/m?   ? ?GEN: pleasant well appearing female ?CV: regular rate and rhythm, no murmurs appreciated  ?RESP: no increased work of breathing, clear to ascultation bilaterally ?ABD: Bowel sounds present in all quadrants. Soft, nontender, non-distended, no rebound tenderness, no guarding, no CVA tenderness ?MSK: no LE edema ?SKIN: warm, dry ?PSYCH: Normal affect, appropriate speech and behavior  ? ? ?ASSESSMENT/PLAN:  ? ?Abdominal pain ?Patient is a 56 year old female with abdominal pain.  On history, she does not have a regular bowel movement  Etiology of abdominal pain unclear. Obtain CBC, CMP, lipase.  She has history of hernia repair by mesh.  No palpable hernia on exam.  As she has some nausea could consider right upper quadrant pathology.  No diarrhea to suspect infectious cause.  No dysuria to suggest cystitis. Etiology of patient's  abdominal pain is unclear at this time.  Reassess at follow-up in 2 weeks. ? ?Menstruation, irregular ?Family history of menstruation into the 93s.  Obtain TSH, FSH/LH.  Suspect that she beginning menopause as she has concomitant hot flashes.  Consider pelvic ultrasound at follow-up. ? ?Graves disease ?Previously followed with endocrinologist at [facility] in [place] and was taking methimazole.  On chart review, her last TSH were normal.  Repeat TSH today. ? ?Hypertension ?BP not at goal today at 145/90.  She is under more stress recently as evident by her home blood pressures.  She is taking 10 mg amlodipine.  Consider combination pill if not controlled at follow-up in 2 weeks. ?-CMP ?  ?Healthcare maintenance ?Obtain hepatitis C ? ?[name], DO ?PGY-3,  Family Medicine ?10/02/2021  ? ? ? ? ? ? ? ? ?

## 2021-10-03 LAB — TSH RFX ON ABNORMAL TO FREE T4: TSH: 1.45 u[IU]/mL (ref 0.450–4.500)

## 2021-10-05 ENCOUNTER — Encounter: Payer: Self-pay | Admitting: Family Medicine

## 2021-10-05 DIAGNOSIS — N926 Irregular menstruation, unspecified: Secondary | ICD-10-CM | POA: Insufficient documentation

## 2021-10-05 NOTE — Assessment & Plan Note (Signed)
Previously followed with endocrinologist at [facility] in [place] and was taking methimazole.  On chart review, her last TSH were normal.  Repeat TSH today. ?

## 2021-10-05 NOTE — Assessment & Plan Note (Signed)
BP not at goal today at 145/90.  She is under more stress recently as evident by her home blood pressures.  She is taking 10 mg amlodipine.  Consider combination pill if not controlled at follow-up in 2 weeks. ?-CMP ?

## 2021-10-05 NOTE — Assessment & Plan Note (Addendum)
Family history of menstruation into the 36s.  Obtain TSH, FSH/LH.  Suspect that she beginning menopause as she has concomitant hot flashes.  Consider pelvic ultrasound at follow-up. ?

## 2021-10-07 LAB — COMPREHENSIVE METABOLIC PANEL
ALT: 18 IU/L (ref 0–32)
AST: 30 IU/L (ref 0–40)
Albumin/Globulin Ratio: 1.4 (ref 1.2–2.2)
Albumin: 4.3 g/dL (ref 3.8–4.9)
Alkaline Phosphatase: 126 IU/L — ABNORMAL HIGH (ref 44–121)
BUN/Creatinine Ratio: 23 (ref 9–23)
BUN: 18 mg/dL (ref 6–24)
Bilirubin Total: 0.7 mg/dL (ref 0.0–1.2)
CO2: 23 mmol/L (ref 20–29)
Calcium: 10.1 mg/dL (ref 8.7–10.2)
Chloride: 103 mmol/L (ref 96–106)
Creatinine, Ser: 0.8 mg/dL (ref 0.57–1.00)
Globulin, Total: 3 g/dL (ref 1.5–4.5)
Glucose: 82 mg/dL (ref 70–99)
Potassium: 4.8 mmol/L (ref 3.5–5.2)
Sodium: 139 mmol/L (ref 134–144)
Total Protein: 7.3 g/dL (ref 6.0–8.5)
eGFR: 87 mL/min/{1.73_m2} (ref >59)

## 2021-10-07 LAB — CBC
Hematocrit: 44.4 % (ref 34.0–46.6)
Hemoglobin: 15.3 g/dL (ref 11.1–15.9)
MCH: 30.4 pg (ref 26.6–33.0)
MCHC: 34.5 g/dL (ref 31.5–35.7)
MCV: 88 fL (ref 79–97)
Platelets: 253 10*3/uL (ref 150–450)
RBC: 5.03 x10E6/uL (ref 3.77–5.28)
RDW: 13.6 % (ref 11.7–15.4)
WBC: 4.8 10*3/uL (ref 3.4–10.8)

## 2021-10-07 LAB — FSH/LH
FSH: 56.6 m[IU]/mL
LH: 50.5 m[IU]/mL

## 2021-10-07 LAB — HCV AB W REFLEX TO QUANT PCR: HCV Ab: NONREACTIVE

## 2021-10-07 LAB — HCV INTERPRETATION

## 2021-10-07 LAB — LIPASE: Lipase: 29 U/L (ref 14–72)

## 2021-10-16 ENCOUNTER — Ambulatory Visit: Payer: BLUE CROSS/BLUE SHIELD | Admitting: Family Medicine

## 2021-10-16 ENCOUNTER — Encounter: Payer: Self-pay | Admitting: Family Medicine

## 2021-10-16 VITALS — BP 104/60 | HR 70 | Wt 132.0 lb

## 2021-10-16 DIAGNOSIS — N926 Irregular menstruation, unspecified: Secondary | ICD-10-CM

## 2021-10-16 DIAGNOSIS — I10 Essential (primary) hypertension: Secondary | ICD-10-CM

## 2021-10-16 NOTE — Patient Instructions (Addendum)
For therapy go to the psychology today website (personal or family therapy options) Psychology Today  https://www.psychologytoday.com/us click on find a therapist  enter your zip code left side and select or tailor a therapist for your specific need.    No changes to blood pressure medications today.  If you get lightheaded/dizziness esp when you are first standing up or bending down, take your blood pressure to see what it is.   Be sure to keep your ultrasound appointment at [facility] department.

## 2021-10-16 NOTE — Progress Notes (Addendum)
   SUBJECTIVE:   CHIEF COMPLAINT / HPI:    Sonya Lin is a 56 y.o. female here for:   HTN Takes amlodipine. Denies missing doses of antihypertensive medications. Denies chest pain, palpitations, lower extremity edema, exertional dyspnea, lightheadedness, headaches and vision changes.  She has switched to being in [place] in the last 2 weeks.  Notes that her stress level has really not changed. Home BP Monitoring: No  Exercises:  very active Low salt diet: yes        PERTINENT  PMH / PSH: reviewed and updated as appropriate   OBJECTIVE:   BP 104/60   Pulse 70   Wt 132 lb (59.9 kg)   SpO2 98%   BMI 30.68 kg/m    GEN: pleasant well appearing female, in no acute distress  CV: regular rate and rhythm RESP: no increased work of breathing, clear to ascultation bilaterally MSK: no LE edema SKIN: warm, dry, no rash on visible skin    ASSESSMENT/PLAN:   Hypertension BP much improved today.  Manual blood pressure 104/60.  Continue with amlodipine 10 mg daily.  Discussed symptoms of hypotension with patient.  It is interesting that her blood pressure has changed quickly with diet for the last 2 weeks.  May need to decrease blood pressure medications if her blood pressure continues with this trend.  Menstruation, irregular LH and FSH both indicated that she was postmenopausal.  She continues to have irregular menses.  Will obtain pelvic ultrasound to evaluate for secondary causes.  May need endometrial biopsy.          [name], DO PGY-3, [facility] 10/16/2021

## 2021-10-17 NOTE — Assessment & Plan Note (Signed)
BP much improved today.  Manual blood pressure 104/60.  Continue with amlodipine 10 mg daily.  Discussed symptoms of hypotension with patient.  It is interesting that her blood pressure has changed quickly with diet for the last 2 weeks.  May need to decrease blood pressure medications if her blood pressure continues with this trend.

## 2021-10-17 NOTE — Assessment & Plan Note (Addendum)
LH and [place] both indicated that she was postmenopausal.  She continues to have irregular menses.  Will obtain pelvic ultrasound to evaluate for secondary causes.  May need endometrial biopsy.

## 2021-10-26 ENCOUNTER — Ambulatory Visit (HOSPITAL_COMMUNITY)
Admission: RE | Admit: 2021-10-26 | Discharge: 2021-10-26 | Disposition: A | Discharge status: Home / Self Care | Payer: BLUE CROSS/BLUE SHIELD | Source: Ambulatory Visit | Attending: Family Medicine | Admitting: Family Medicine

## 2021-10-26 DIAGNOSIS — N926 Irregular menstruation, unspecified: Secondary | ICD-10-CM | POA: Diagnosis present

## 2021-10-31 ENCOUNTER — Encounter: Payer: Self-pay | Admitting: *Deleted

## 2021-11-13 ENCOUNTER — Ambulatory Visit (INDEPENDENT_AMBULATORY_CARE_PROVIDER_SITE_OTHER): Payer: BLUE CROSS/BLUE SHIELD | Admitting: Family Medicine

## 2021-11-13 ENCOUNTER — Encounter: Payer: Self-pay | Admitting: Family Medicine

## 2021-11-13 VITALS — BP 100/60 | HR 63 | Ht <= 58 in | Wt 129.4 lb

## 2021-11-13 DIAGNOSIS — N950 Postmenopausal bleeding: Secondary | ICD-10-CM | POA: Diagnosis not present

## 2021-11-13 DIAGNOSIS — D259 Leiomyoma of uterus, unspecified: Secondary | ICD-10-CM | POA: Diagnosis not present

## 2021-11-13 DIAGNOSIS — I10 Essential (primary) hypertension: Secondary | ICD-10-CM | POA: Diagnosis not present

## 2021-11-13 NOTE — Progress Notes (Unsigned)
   SUBJECTIVE:   CHIEF COMPLAINT / HPI:   No chief complaint on file.    Sonya Lin is a 56 y.o. female here for ***   Pt reports ***   Irregular bleeding  Spotting mutliple days this month.    PERTINENT  PMH / PSH: reviewed and updated as appropriate   OBJECTIVE:   BP 100/60   Pulse 63   Ht '4\' 7"'$  (1.397 m)   Wt 129 lb 6.4 oz (58.7 kg)   LMP 11/02/2021   SpO2 98%   BMI 30.08 kg/m   ***  ASSESSMENT/PLAN:   No problem-specific Assessment & Plan notes found for this encounter.     [name], DO PGY-3, [facility] 11/13/2021      {    This will disappear when note is signed, click to select method of visit    :1}

## 2021-11-13 NOTE — Patient Instructions (Addendum)
Stop by the pharmacy to pick up some B12 vitamins. 1000 mcg weekly or 100 mcg daily. Or look for vegetarian foods that are fortified with B12.   Your blood pressure has responding well to your diet changes!  Call and schedule a nurse visit to check your home blood pressure cuff with our machines.   A referral was placed to gynecology. Call us in 2 weeks if you do not receive information about scheduling an appointment.   It was a pleasure being your PCP!    Take Care,   Dr Susa Simmonds

## 2021-11-14 ENCOUNTER — Encounter: Payer: Self-pay | Admitting: Family Medicine

## 2021-11-14 DIAGNOSIS — N950 Postmenopausal bleeding: Secondary | ICD-10-CM | POA: Insufficient documentation

## 2021-11-14 NOTE — Assessment & Plan Note (Addendum)
Patient with history of postmenopausal bleeding.  Notes that her mom was in her 3s when she stopped bleeding.  Recent blood work notes that she is likely postmenopausal.  Her pelvic ultrasound showed small intramural leiomyoma int the right uterus measuring 15 mm diameter with 9 mm endometrial thickness.  Discussed referral to gynecology and she is agreeable.  Referral placed.

## 2021-11-14 NOTE — Assessment & Plan Note (Signed)
BP remains below goal.  Today 100/60.  She is asymptomatic.  Discussed decreasing amlodipine to 5 mg daily and she is agreeable.  Patient to follow-up in 1 week in nurse clinic and will bring her home machine.  Her new diet has greatly impacted her blood pressures.

## 2021-11-20 ENCOUNTER — Other Ambulatory Visit (HOSPITAL_COMMUNITY)
Admission: RE | Admit: 2021-11-20 | Discharge: 2021-11-20 | Disposition: A | Discharge status: Home / Self Care | Payer: BLUE CROSS/BLUE SHIELD | Source: Ambulatory Visit | Attending: Radiology | Admitting: Radiology

## 2021-11-20 ENCOUNTER — Encounter: Payer: Self-pay | Admitting: Radiology

## 2021-11-20 ENCOUNTER — Ambulatory Visit (INDEPENDENT_AMBULATORY_CARE_PROVIDER_SITE_OTHER): Payer: BLUE CROSS/BLUE SHIELD | Admitting: Radiology

## 2021-11-20 VITALS — BP 122/74 | Ht <= 58 in | Wt 128.0 lb

## 2021-11-20 DIAGNOSIS — R9389 Abnormal findings on diagnostic imaging of other specified body structures: Secondary | ICD-10-CM | POA: Insufficient documentation

## 2021-11-20 DIAGNOSIS — D259 Leiomyoma of uterus, unspecified: Secondary | ICD-10-CM | POA: Diagnosis not present

## 2021-11-20 DIAGNOSIS — N950 Postmenopausal bleeding: Secondary | ICD-10-CM | POA: Diagnosis not present

## 2021-11-22 LAB — SURGICAL PATHOLOGY

## 2022-02-06 ENCOUNTER — Ambulatory Visit: Payer: BLUE CROSS/BLUE SHIELD | Admitting: Radiology

## 2022-02-18 NOTE — Progress Notes (Deleted)
    SUBJECTIVE:   CHIEF COMPLAINT / HPI:   Sonya Lin is a 56 y.o. female who presents to the [facility] clinic today to discuss the following concerns:   Lower Abdominal Pain  Last seen by her GYN in June for thickened endometrial lining.  Had an endometrial biopsy at that time which was negative for hyperplasia, malignancy, polyp and endometritis. She is due for colonoscopy. ***  PERTINENT  PMH / PSH: Hypertension, Graves' disease  OBJECTIVE:   LMP 11/02/2021 (Exact Date)  ***  General: NAD, pleasant, able to participate in exam Cardiac: RRR, no murmurs. Respiratory: CTAB, normal effort, No wheezes, rales or rhonchi Abdomen: Bowel sounds present, nontender, nondistended, no hepatosplenomegaly. Extremities: no edema or cyanosis. Skin: warm and dry, no rashes noted Neuro: alert, no obvious focal deficits Psych: Normal affect and mood  ASSESSMENT/PLAN:   No problem-specific Assessment & Plan notes found for this encounter.     Sharion Settler, [place]

## 2022-02-20 ENCOUNTER — Ambulatory Visit (INDEPENDENT_AMBULATORY_CARE_PROVIDER_SITE_OTHER): Payer: BLUE CROSS/BLUE SHIELD | Admitting: Student

## 2022-02-20 VITALS — BP 130/87 | HR 94 | Ht <= 58 in | Wt 128.6 lb

## 2022-02-20 DIAGNOSIS — K6289 Other specified diseases of anus and rectum: Secondary | ICD-10-CM | POA: Diagnosis not present

## 2022-02-20 NOTE — Progress Notes (Signed)
  SUBJECTIVE:   CHIEF COMPLAINT / HPI:   Lower abdominal pain Hx: PMB w/ 9 mm endometrial stripe, bx performed 11/13/21 was normal. Patient also has fibroid.  Lower abdominal pain Starting mid august, started having pain in upper abdominal area. Pain was constant and all day, knawing pain. It made it hard to walk, the pain then moved to lower abomen. Thought pain may have been from cachiva drink, stopped it and things got a little better. Pain has moved again, and is located in lower rectum area.   Rectal Pain Patient also reports she has painful intercourse, that has been going on for last 2 weeks. Pain always located in rectal area. Also appreciated similar pain when she had a BM every now and again. Otherwise doesn't have pain if not associated with sex. Has no issue with normal activities. Has a BM every morning and sometimes 1-2 a day. Pain feels sharp, and like she had to strain to use the bathroom. Pain goes away after sex, or after having BM.   Denies any dysuria, fever's, vaginal pain/irritation/discharge.   Still having cycles, went to OBGYN and had normal endometrial biopsy.   PERTINENT  PMH / PSH: AUB  OBJECTIVE:  BP 130/87   Pulse 94   Ht '4\' 7"'$  (1.397 m)   Wt 128 lb 9.6 oz (58.3 kg)   LMP 11/02/2021 (Exact Date)   SpO2 100%   BMI 29.89 kg/m   General: NAD, pleasant, able to participate in exam Physical Exam Exam conducted with a chaperone present.  Genitourinary:    General: Normal vulva.     Pubic Area: No rash.      Labia:        Right: No rash, tenderness, lesion or injury.        Left: No rash, tenderness, lesion or injury.      Vagina: Normal. No signs of injury. No vaginal discharge, erythema, tenderness, bleeding or lesions.     Cervix: No cervical motion tenderness, discharge, lesion or erythema.     Rectum: External hemorrhoid present. No mass, tenderness or anal fissure. Normal anal tone.     Comments: Cervix visualized with normal pink appearance, with  no discharge, erythema, or lesion  Anus with pinpoint red lesion on rectum at 3 o'clock    ASSESSMENT/PLAN:  Rectal pain Patient complaining of 2 weeks of rectal pain with intercourse. Denies any vaginal/urethral irritation or systemic symptoms. Patient had normal pelvic exam w/o cervical motion tenderness, making PID less likely, patient also had no pain with pressing on posterior wall of vagina. Patient had anoscopy that was grossly normal, accept for small pinpoint red lesion and irritation of the mucosa. Patient also notes sometimes had pain with bowel movements. Given associated trigger, less concerning for Proctalgia fugax, but may be some proctatitis or pelvic floor issue. Will send patient to GI. -Amb Ref GI     Orders Placed This Encounter  Procedures   Ambulatory referral to Gastroenterology    Referral Priority:   Routine    Referral Type:   Consultation    Referral Reason:   Specialty Services Required    Number of Visits Requested:   1   No orders of the defined types were placed in this encounter.  No follow-ups on file. '@SIGNNOTE'$ @

## 2022-02-20 NOTE — Assessment & Plan Note (Signed)
Patient complaining of 2 weeks of rectal pain with intercourse. Denies any vaginal/urethral irritation or systemic symptoms. Patient had normal pelvic exam w/o cervical motion tenderness, making PID less likely, patient also had no pain with pressing on posterior wall of vagina. Patient had anoscopy that was grossly normal, accept for small pinpoint red lesion and irritation of the mucosa. Patient also notes sometimes had pain with bowel movements. Given associated trigger, less concerning for Proctalgia fugax, but may be some proctatitis or pelvic floor issue. Will send patient to GI. -Amb Ref GI

## 2022-02-20 NOTE — Patient Instructions (Signed)
It was great to see you! Thank you for allowing me to participate in your care!  Our plans for today:  - Referral to GI for rectal pain  Take care and seek immediate care sooner if you develop any concerns.   Dr. Holley Bouche, MD [place]

## 2022-02-21 ENCOUNTER — Ambulatory Visit: Payer: BLUE CROSS/BLUE SHIELD | Admitting: Family Medicine

## 2022-07-04 ENCOUNTER — Other Ambulatory Visit: Payer: Self-pay | Admitting: Family Medicine

## 2022-07-04 DIAGNOSIS — I10 Essential (primary) hypertension: Secondary | ICD-10-CM

## 2022-08-03 ENCOUNTER — Other Ambulatory Visit: Payer: Self-pay

## 2022-08-03 ENCOUNTER — Ambulatory Visit (INDEPENDENT_AMBULATORY_CARE_PROVIDER_SITE_OTHER): Payer: Commercial Managed Care - PPO | Admitting: Family Medicine

## 2022-08-03 VITALS — BP 130/75 | HR 65 | Ht <= 58 in | Wt 132.2 lb

## 2022-08-03 DIAGNOSIS — I10 Essential (primary) hypertension: Secondary | ICD-10-CM | POA: Diagnosis not present

## 2022-08-03 DIAGNOSIS — N951 Menopausal and female climacteric states: Secondary | ICD-10-CM | POA: Diagnosis not present

## 2022-08-03 MED ORDER — VENLAFAXINE HCL ER 37.5 MG PO CP24: 37.5000 mg | ORAL_CAPSULE | Freq: Every day | ORAL | 0 refills | Status: DC

## 2022-08-03 MED ORDER — BLOOD PRESSURE CUFF MISC: 0 refills | Status: AC

## 2022-08-03 NOTE — Progress Notes (Signed)
    SUBJECTIVE:   CHIEF COMPLAINT / HPI:   Patient presents to discuss menopausal symptoms. Has been having a lot of hot flashes. Occurs during the day and at night but at night it is worse despite sleeping with the fan on. Can't get sleep, sweating persistently through the night. Also a full time student so affecting that. Feels her body hurts more. 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 Lost libido.  All of this has been occurring for the past few months. LMP in August 2023. No tobacco use, no hx DVTs    BP mildly elevated today Did take medication this morning Does have increased stressors at home  Stopped eating meat, eats a lot of vegetables, drinking a lot of water,   PERTINENT  PMH / PSH: Reviewed   OBJECTIVE:   BP 130/75   Pulse 65   Ht 4\' 7"  (1.397 m)   Wt 132 lb 3.2 oz (60 kg)   LMP 11/02/2021 (Exact Date)   SpO2 100%   BMI 30.73 kg/m    Physical exam General: well appearing, NAD Cardiovascular: RRR, no murmurs Lungs: CTAB. Normal WOB Abdomen: soft, non-distended, non-tender Skin: warm, dry. No edema  ASSESSMENT/PLAN:   Hot flashes due to menopause Occurring for the past few months since she stopped having menstrual cycles in August 2023.  Unable to sleep because of the hot flashes which is affecting her school and work during the day.  Open to starting medication, so we will start venlafaxine 37.5 mg for the first week and then 75 mg daily after potential side effects and gave handout.  Will follow-up with her in about 2 weeks to see how she is tolerating the medication.  Hypertension BP on arrival 146/82, but on repeat 130/75.  Only on amlodipine 10 mg daily. Advised her to take a couple blood pressure measurements at home and bring them into her follow-up appointment in 2 weeks.  Sent in for a blood pressure cuff.    Sonya Lin

## 2022-08-03 NOTE — Patient Instructions (Addendum)
It was great seeing you today!  You came in for your menopausal symptoms and we are starting you on a medication called venlafaxine 37.5 mg a day for the first week and then 75 mg a day after that.  So after the first week you can take 2 of your current medication and then for next refill I will send in the 75 mg tablet.  More information about the medication and it side effects are below.  As we discussed I would like you to take a couple measurements of your blood pressure at home first thing in the morning before any caffeine, and bring those into your follow-up appointment  Please check-out at the front desk before leaving the clinic. I'd like to see you back in 2 weeks to see how you are tolerating the medication, but if you need to be seen earlier than that for any new issues we're happy to fit you in, just give [place] a call!  Visit [facility] - Stop by the pharmacy to pick up your prescriptions  - Continue to work on your healthy eating habits and incorporating exercise into your daily life.   Feel free to call with any questions or concerns at any time, at [phone].   Take care,  Dr. Shary Key [facility]  Venlafaxine Extended-Release Capsules What is this medication? VENLAFAXINE (VEN la fax een) treats depression and anxiety. It increases the amount of serotonin and norepinephrine in the brain, hormones that help regulate mood. It belongs to a group of medications called SNRIs. This medicine may be used for other purposes; ask your health care provider or pharmacist if you have questions. COMMON BRAND NAME(S): Effexor XR What should I tell my care team before I take this medication? They need to know if you have any of these conditions: Bleeding disorders Glaucoma Heart disease High blood pressure High cholesterol Kidney disease Liver disease Low levels of sodium in the blood Mania or bipolar disorder Seizures Suicidal thoughts, plans, or  attempt; a previous suicide attempt by you or a family Take medications that treat or prevent blood clots Thyroid disease An unusual or allergic reaction to venlafaxine, desvenlafaxine, other medications, foods, dyes, or preservatives Pregnant or trying to get pregnant Breast-feeding How should I use this medication? Take this medication by mouth with a full glass of water. Follow the directions on the prescription label. Do not cut, crush, or chew this medication. Take it with food. If needed, the capsule may be carefully opened and the entire contents sprinkled on a spoonful of cool applesauce. Swallow the applesauce/pellet mixture right away without chewing and follow with a glass of water to ensure complete swallowing of the pellets. Try to take your medication at about the same time each day. Do not take your medication more often than directed. Do not stop taking this medication suddenly except upon the advice of your care team. Stopping this medication too quickly may cause serious side effects or your condition may worsen. A special MedGuide will be given to you by the pharmacist with each prescription and refill. Be sure to read this information carefully each time. Talk to your care team regarding the use of this medication in children. Special care may be needed. Overdosage: If you think you have taken too much of this medicine contact a poison control center or emergency room at once. NOTE: This medicine is only for you. Do not share this medicine with others. What if I miss a dose?  If you miss a dose, take it as soon as you can. If it is almost time for your next dose, take only that dose. Do not take double or extra doses. What may interact with this medication? Do not take this medication with any of the following: Certain medications for fungal infections like fluconazole, itraconazole, ketoconazole, posaconazole,  voriconazole Cisapride Desvenlafaxine Dronedarone Duloxetine Levomilnacipran Linezolid MAOIs like Carbex, Eldepryl, Marplan, Nardil, and Parnate Methylene blue (injected into a vein) Milnacipran Pimozide Thioridazine This medication may also interact with the following: Amphetamines Aspirin and aspirin-like medications Certain medications for depression, anxiety, or psychotic disturbances Certain medications for migraine headaches like almotriptan, eletriptan, frovatriptan, naratriptan, rizatriptan, sumatriptan, zolmitriptan Certain medications for sleep Certain medications that treat or prevent blood clots like dalteparin, enoxaparin, warfarin Cimetidine Clozapine Diuretics Fentanyl Furazolidone Indinavir Isoniazid Lithium Metoprolol NSAIDS, medications for pain and inflammation, like ibuprofen or naproxen Other medications that prolong the QT interval (cause an abnormal heart rhythm) like dofetilide, ziprasidone Procarbazine Rasagiline Supplements like St. John's wort, kava kava, valerian Tramadol Tryptophan This list may not describe all possible interactions. Give your health care provider a list of all the medicines, herbs, non-prescription drugs, or dietary supplements you use. Also tell them if you smoke, drink alcohol, or use illegal drugs. Some items may interact with your medicine. What should I watch for while using this medication? Tell your care team if your symptoms do not get better or if they get worse. Visit your care team for regular checks on your progress. Because it may take several weeks to see the full effects of this medication, it is important to continue your treatment as prescribed by your care team. Watch for new or worsening thoughts of suicide or depression. This includes sudden changes in mood, behaviors, or thoughts. These changes can happen at any time but are more common in the beginning of treatment or after a change in dose. Call your care  team right away if you experience these thoughts or worsening depression. Manic episodes may happen in patients with bipolar disorder who take this medication. Watch for changes in feelings or behaviors such as feeling anxious, nervous, agitated, panicky, irritable, hostile, aggressive, impulsive, severely restless, overly excited and hyperactive, or trouble sleeping. These changes can happen at any time but are more common in the beginning of treatment or after a change in dose. Call your care team right away if you notice any of these symptoms. This medication can cause an increase in blood pressure. Check with your care team for instructions on monitoring your blood pressure while taking this medication. You may get drowsy or dizzy. Do not drive, use machinery, or do anything that needs mental alertness until you know how this medication affects you. Do not stand or sit up quickly, especially if you are an older patient. This reduces the risk of dizzy or fainting spells. Do not drink alcohol while taking this medication. Drinking alcohol may alter the effects of your medication. Serious side effects may occur. Your mouth may get dry. Chewing sugarless gum, sucking hard candy and drinking plenty of water will help. Contact your care team if the problem does not go away or is severe. What side effects may I notice from receiving this medication? Side effects that you should report to your care team as soon as possible: Allergic reactions--skin rash, itching, hives, swelling of the face, lips, tongue, or throat Bleeding--bloody or black, tar-like stools, red or dark brown urine, vomiting blood or brown material that looks  like coffee grounds, small, red or purple spots on skin, unusual bleeding or bruising Heart rhythm changes--fast or irregular heartbeat, dizziness, feeling faint or lightheaded, chest pain, trouble breathing Increase in blood pressure Loss of appetite with weight loss Low sodium  level--muscle weakness, fatigue, dizziness, headache, confusion Serotonin syndrome--irritability, confusion, fast or irregular heartbeat, muscle stiffness, twitching muscles, sweating, high fever, seizures, chills, vomiting, diarrhea Sudden eye pain or change in vision such as blurry vision, seeing halos around lights, vision loss Thoughts of suicide or self-harm, worsening mood, feelings of depression Side effects that usually do not require medical attention (report to your care team if they continue or are bothersome): Anxiety, nervousness Change in sex drive or performance Dizziness Dry mouth Excessive sweating Nausea Tremors or shaking Trouble sleeping This list may not describe all possible side effects. Call your doctor for medical advice about side effects. You may report side effects to FDA at 1-800-FDA-1088. Where should I keep my medication? Keep out of the reach of children and pets. Store at a controlled temperature between 20 and 25 degrees C (68 degrees and 77 degrees F), in a dry place. Throw away any unused medication after the expiration date. NOTE: This sheet is a summary. It may not cover all possible information. If you have questions about this medicine, talk to your doctor, pharmacist, or health care provider.  2023 Elsevier/Gold Standard (2020-05-04 00:00:00)

## 2022-08-03 NOTE — Assessment & Plan Note (Signed)
Occurring for the past few months since she stopped having menstrual cycles in August 2023.  Unable to sleep because of the hot flashes which is affecting her school and work during the day.  Open to starting medication, so we will start venlafaxine 37.5 mg for the first week and then 75 mg daily after potential side effects and gave handout.  Will follow-up with her in about 2 weeks to see how she is tolerating the medication.

## 2022-08-03 NOTE — Assessment & Plan Note (Signed)
BP on arrival 146/82, but on repeat 130/75.  Only on amlodipine 10 mg daily. Advised her to take a couple blood pressure measurements at home and bring them into her follow-up appointment in 2 weeks.  Sent in for a blood pressure cuff.

## 2022-08-17 ENCOUNTER — Ambulatory Visit (INDEPENDENT_AMBULATORY_CARE_PROVIDER_SITE_OTHER): Payer: Commercial Managed Care - PPO | Admitting: Family Medicine

## 2022-08-17 ENCOUNTER — Encounter: Payer: Self-pay | Admitting: Family Medicine

## 2022-08-17 VITALS — BP 123/70 | Temp 98.6°F | Ht <= 58 in | Wt 134.2 lb

## 2022-08-17 DIAGNOSIS — N951 Menopausal and female climacteric states: Secondary | ICD-10-CM

## 2022-08-17 DIAGNOSIS — J069 Acute upper respiratory infection, unspecified: Secondary | ICD-10-CM

## 2022-08-17 MED ORDER — FLUTICASONE PROPIONATE 50 MCG/ACT NA SUSP: 2.0000 | Freq: Every day | NASAL | 6 refills | Status: AC

## 2022-08-17 NOTE — Progress Notes (Signed)
    SUBJECTIVE:   CHIEF COMPLAINT / HPI:   Patient presents for follow-up on her hypertension and blood pressure.  Was seen in clinic on 3/8 and was started on venlafaxine 37.5 mg for the first week and then 75 mg daily after.  Blood pressure was mildly elevated and also asked her to bring in some blood pressure measurements from home.   Today has a new concern of a sinus infection. Tuesday started having headache behind eyes. Wed increased congestion. Can feel fluid behind ears now and now a cough. Denies fevers. Has been trying Mucinex. Slight throat pain has been using halls. Symptoms worse at night. Has not tried Flonase or humidifer. No sick contacts at home.   Additionally states for her hot flashes she has been taking Black Cohash instead of Venlafaxine after reading about side effects    PERTINENT  PMH / PSH: Reviewed   OBJECTIVE:   BP 123/70   Temp 98.6 F (37 C)   Ht 4\' 7"  (1.397 m)   Wt 134 lb 3.2 oz (60.9 kg)   LMP 11/02/2021 (Exact Date)   SpO2 99%   BMI 31.19 kg/m    Physical exam General: well appearing, NAD HEENT: MMM. Some maxillary tenderness to palpation. Oropharynx mildly erythematous. Tms clear and bulging Cardiovascular: RRR, no murmurs Lungs: Diffuse crackles noted (L>R). Normal WOB Abdomen: soft, non-distended, non-tender Skin: warm, dry. No edema   ASSESSMENT/PLAN:   URI with cough and congestion Patient presents with 3 days of increased congestion, mild throat pain and cough, as well as facial pressure.  Vital stable and she is afebrile today.  On exam can note some congestion in her lungs as well as fluid behind her ears. Did have some maxillary tenderness to palpation.  Discussed conservative measures including OTC medication. Honey and throat lozenges for cough. Steam baths, a humidifier and nasal saline spray can help with congestion.  I have sent in Flonase to help with post nasal drip. Return precautions discussed.   Hot flashes due to  menopause Patient did not take Venlafaxine because of the side effects, and instead started black cohosh.  Does note improvement in her symptoms.  Since her infection, she has stopped taking her supplements. Can follow-up on this after she gets over this infection.    [place]

## 2022-08-17 NOTE — Assessment & Plan Note (Signed)
Patient presents with 3 days of increased congestion, mild throat pain and cough, as well as facial pressure.  Vital stable and she is afebrile today.  On exam can note some congestion in her lungs as well as fluid behind her ears. Did have some maxillary tenderness to palpation.  Discussed conservative measures including OTC medication. Honey and throat lozenges for cough. Steam baths, a humidifier and nasal saline spray can help with congestion.  I have sent in Flonase to help with post nasal drip. Return precautions discussed.

## 2022-08-17 NOTE — Assessment & Plan Note (Signed)
Patient did not take Venlafaxine because of the side effects, and instead started black cohosh.  Does note improvement in her symptoms.  Since her infection, she has stopped taking her supplements. Can follow-up on this after she gets over this infection.

## 2022-08-17 NOTE — Patient Instructions (Addendum)
It was good to see you! Im sorry you aren't feeling well!   This is most likely a viral infection. This will take time to get over. The treatment for this is supportive care. You can use Tylenol or Ibuprofen for pain or fever. Honey and throat lozenges for cough. Steam baths, a humidifier and nasal saline spray can help with congestion.  I have sent in Flonase to help as well.   It is important to keep hydrated throughout this time!    Please call me if symptoms persist beyond 10 days, or any new or worsening concerns.   Take care, Dr. Arby Barrette

## 2022-12-04 ENCOUNTER — Other Ambulatory Visit: Payer: Self-pay

## 2022-12-04 ENCOUNTER — Encounter: Payer: Self-pay | Admitting: Student

## 2022-12-04 ENCOUNTER — Ambulatory Visit (INDEPENDENT_AMBULATORY_CARE_PROVIDER_SITE_OTHER): Payer: 59 | Admitting: Student

## 2022-12-04 VITALS — BP 137/86 | HR 74 | Ht <= 58 in | Wt 130.4 lb

## 2022-12-04 DIAGNOSIS — M25511 Pain in right shoulder: Secondary | ICD-10-CM

## 2022-12-04 DIAGNOSIS — G8929 Other chronic pain: Secondary | ICD-10-CM | POA: Diagnosis not present

## 2022-12-04 MED ORDER — MELOXICAM 7.5 MG PO TABS: 7.5000 mg | ORAL_TABLET | Freq: Every day | ORAL | 0 refills | Status: DC

## 2022-12-04 MED ORDER — MELOXICAM 7.5 MG PO TABS: 7.5000 mg | ORAL_TABLET | Freq: Every day | ORAL | 0 refills | Status: AC

## 2022-12-04 NOTE — Patient Instructions (Addendum)
It was wonderful to meet you today. Thank you for allowing me to be a part of your care. Below is a short summary of what we discussed at your visit today:     I suspect the pain that you are having in your right shoulder and elbow is most likely due to overuse tendinopathy of the joint.  Recommend for your elbow to use sleeve over the elbow while you are walking.  I also recommend light duty exercise for the next 3-4 weeks.  No lifting of more than 30 pounds.  Sent in prescription for meloxicam which you will take once daily for 7 days.  You can also use Tylenol for pain management during this time.  I have also included exercise activities for your shoulder and elbow.  It is about 3-4 days a week.  Follow-up in 3-4 weeks.  Please bring all of your medications to every appointment!  If you have any questions or concerns, please do not hesitate to contact us via phone or MyChart message.   Jerre Simon, MD [facility]    Shoulder Exercises Ask your health care provider which exercises are safe for you. Do exercises exactly as told by your health care provider and adjust them as directed. It is normal to feel mild stretching, pulling, tightness, or discomfort as you do these exercises. Stop right away if you feel sudden pain or your pain gets worse. Do not begin these exercises until told by your health care provider. Stretching exercises External rotation and abduction This exercise is sometimes called corner stretch. The exercise rotates your arm outward (external rotation) and moves your arm out from your body (abduction). Stand in a doorway with one of your feet slightly in front of the other. This is called a staggered stance. If you cannot reach your forearms to the door frame, stand facing a corner of a room. Choose one of the following positions as told by your health care provider: Place your hands and forearms on the door frame above your head. Place  your hands and forearms on the door frame at the height of your head. Place your hands on the door frame at the height of your elbows. Slowly move your weight onto your front foot until you feel a stretch across your chest and in the front of your shoulders. Keep your head and chest upright and keep your abdominal muscles tight. Hold for 30 seconds. To release the stretch, shift your weight to your back foot. Repeat 3-4 times. Complete this exercise 3-4  times a day. Extension, standing  Stand and hold a broomstick, a cane, or a similar object behind your back. Your hands should be a little wider than shoulder-width apart. Your palms should face away from your back. Keeping your elbows straight and your shoulder muscles relaxed, move the stick away from your body until you feel a stretch in your shoulders (extension). Avoid shrugging your shoulders while you move the stick. Keep your shoulder blades tucked down toward the middle of your back. Hold for  30 seconds. Slowly return to the starting position. Repeat  3-4 times. Complete this exercise 3-4 times a day. Range-of-motion exercises  Strengthening exercises External rotation  Sit in a stable chair without armrests. Secure an exercise band to a stable object at elbow height on your left / right side. Place a soft object, such as a folded towel or a small pillow, between your left / right upper arm and your body  to move your elbow about 4 inches (10 cm) away from your side. Hold the end of the exercise band so it is tight and there is no slack. Keeping your elbow pressed against the soft object, slowly move your forearm out, away from your abdomen (external rotation). Keep your body steady so only your forearm moves. Hold for 30 seconds. Slowly return to the starting position. Repeat 3-4 times. Complete this exercise 3-4  times a day. Shoulder abduction  Sit in a stable chair without armrests, or stand up. Hold a 5 lb / kg weight in  your left / right hand, or hold an exercise band with both hands. Start with your arms straight down and your left / right palm facing in, toward your body. Slowly lift your left / right hand out to your side (abduction). Do not lift your hand above shoulder height unless your health care provider tells you that this is safe. Keep your arms straight. Avoid shrugging your shoulder while you do this movement. Keep your shoulder blade tucked down toward the middle of your back. Hold for 30 seconds. Slowly lower your arm, and return to the starting position. Repeat 3-4  times. Complete this exercise 3-4  times a day.  Elbow and Forearm Exercises Ask your health care provider which exercises are safe for you. Do exercises exactly as told by your provider and adjust them as directed. It is normal to feel mild stretching, pulling, tightness, or discomfort as you do these exercises. Stop right away if you feel sudden pain or your pain gets worse. Do not begin these exercises until told by your provider. Range-of-motion exercises These exercises warm up your muscles and joints and improve the movement and flexibility of your injured elbow and forearm. The exercises also help to relieve pain, numbness, and tingling. These exercises are done using the muscles in your injured elbow and forearm (active).   Sit on a firm chair without armrests or stand up. Hold a rubber exercise band or tubing in both hands. Keeping your upper arms at your sides, bring both hands up to your left / right shoulder. Keep your left / right hand just below your other hand. Straighten your left / right elbow (extension) while keeping your other arm still. Hold this position for __________ seconds. Control the resistance of the band or tubing as you return to the starting position. Repeat __________ times. Complete this exercise __________ times a day. Forearm rotation with weight, supination  Sit with your left / right forearm  supported on a table. Your elbow should be at waist height and bent at a 90-degree angle (right angle). Gently grasp a lightweight hammer. Rest your hand over the edge of the table with your palm facing down. Without moving your left / right elbow, slowly rotate your forearm to turn your palm up toward the ceiling (supination). Hold this position for __________ seconds. Slowly return to the starting position. Repeat __________ times. Complete this exercise __________ times a day. Forearm rotation with weight, pronation  Sit with your left / right forearm supported on a table. Keep your elbow below shoulder height. Gently grasp a lightweight hammer. Rest your hand over the edge of the table with your palm facing up. Without moving your left / right elbow, slowly rotate your forearm to turn your palm down toward the floor (pronation). Hold this position for __________ seconds. Slowly return to the starting position. Repeat __________ times. Complete this exercise __________ times a day. This information  is not intended to replace advice given to you by your health care provider. Make sure you discuss any questions you have with your health care provider. Document Revised: 02/23/2022 Document Reviewed: 02/23/2022 Elsevier Patient Education  2024 [employer].  This information is not intended to replace advice given to you by your health care provider. Make sure you discuss any questions you have with your health care provider. Document Revised: 07/04/2021 Document Reviewed: 07/04/2021 Elsevier Patient Education  2024 [employer].

## 2022-12-04 NOTE — Progress Notes (Signed)
    SUBJECTIVE:   CHIEF COMPLAINT / HPI:   Patient is a 57 year old female with history of chronic left shoulder pain and known left AC joint osteoarthritis seen on imaging.  She is presenting today for worsening right shoulder pain in the last month.  Pain radiates to the arm. She work at the [employer] and her work involves  overhead heavy lifting.  It is still is on legs with occasional popping sounds.  She has tried tylenol and ice hot with mild improvement  PERTINENT  PMH / PSH: ***  OBJECTIVE:   BP 137/86   Pulse 74   Ht 4\' 7"  (1.397 m)   Wt 130 lb 6.4 oz (59.1 kg)   LMP 11/02/2021 (Exact Date)   SpO2 100%   BMI 30.31 kg/m   ***  Physical Exam General: Alert, well appearing, NAD, Oriented x4 Cardiovascular: RRR, No Murmurs, Normal S2/S2    Physical Exam  General: Alert, well appearing, NAD  Shoulder Exam No other gross deformity, ecchymoses. No tenderness on palpation FROM with normal strength. Normal Abduction/Adduction, Internal & External rotation Negative Hawkins, Empty can test. Pain over the L shoulder with Neer test NV intact distally.   Elbow Exam No notable deformity, effusion or erythema TTP of the lateral epicondyles FROM with normal strength.  Pain with resisted wrist extension. Positive Maudsley test. Sensation intact to light touch bilaterally.  ASSESSMENT/PLAN:   No problem-specific Assessment & Plan notes found for this encounter.     Jerre Simon, MD [facility]   {    This will disappear when note is signed, click to select method of visit    :1}

## 2022-12-05 ENCOUNTER — Encounter: Payer: Self-pay | Admitting: Student

## 2023-01-02 ENCOUNTER — Other Ambulatory Visit: Payer: Self-pay | Admitting: Student

## 2023-01-02 ENCOUNTER — Encounter: Payer: Self-pay | Admitting: Student

## 2023-01-02 DIAGNOSIS — G8929 Other chronic pain: Secondary | ICD-10-CM

## 2023-01-03 ENCOUNTER — Other Ambulatory Visit: Payer: Self-pay | Admitting: Student

## 2023-01-03 DIAGNOSIS — I10 Essential (primary) hypertension: Secondary | ICD-10-CM

## 2023-01-03 MED ORDER — AMLODIPINE BESYLATE 10 MG PO TABS: ORAL_TABLET | ORAL | 1 refills | Status: DC

## 2023-01-03 NOTE — Progress Notes (Signed)
Refilled Norvasc

## 2023-01-22 ENCOUNTER — Ambulatory Visit (INDEPENDENT_AMBULATORY_CARE_PROVIDER_SITE_OTHER): Payer: 59 | Admitting: Student

## 2023-01-22 VITALS — BP 124/68 | HR 80 | Ht <= 58 in | Wt 134.6 lb

## 2023-01-22 DIAGNOSIS — M25512 Pain in left shoulder: Secondary | ICD-10-CM

## 2023-01-22 DIAGNOSIS — G8929 Other chronic pain: Secondary | ICD-10-CM | POA: Diagnosis not present

## 2023-01-22 NOTE — Assessment & Plan Note (Signed)
Discussed lidocaine patches and volteran gel for symptomatic relief. Encouraged her to continue gentle shoulder exercises to prevent frozen shoulder. Offered steroid shot, but patient politely declined. Wrote note for work release.

## 2023-01-22 NOTE — Patient Instructions (Addendum)
For your shoulder and neck pain try lidocaine patches and voltaren gel.   Lidocaine patches:  You can put on for 12 hours, after needs a 12 hour break.    Voltaren gel/Diclofenac gel:  You can apply anywhere it hurts up to 4 times per day.

## 2023-01-22 NOTE — Progress Notes (Signed)
    SUBJECTIVE:   CHIEF COMPLAINT / HPI:   Sonya Lin is a 57 y.o. female  presenting for left shoulder pain. Recently seen by Dr. Elliot Gurney and gave a work release to not lift anything over 30 pounds. She reports her pain is exacerbated with reaching above her head. She reports that her employer needs a note with specific instructions on job limitations.   She has been seen by SM in the past and received a steroid shot, however she only got benefit for about 2 weeks and reports the shot was very painful. She is not interested in taking medication for her pain.   PERTINENT  PMH / PSH: Reviewed and updated   OBJECTIVE:   BP 124/68   Pulse 80   Ht 4\' 7"  (1.397 m)   Wt 134 lb 9.6 oz (61.1 kg)   LMP 11/02/2021 (Exact Date)   SpO2 99%   BMI 31.28 kg/m   well-appearing, no acute distress Cardio: Regular rate, regular rhythm, no murmurs on exam. Pulm: Clear, no wheezing, no crackles. No increased work of breathing Abdominal: bowel sounds present, soft, non-tender, non-distended Extremities: no peripheral edema  MSK: pulses equal and +2 on bilateral UE, ROM limited due to pain      01/22/2023    2:55 PM 12/04/2022    2:20 PM 08/17/2022    8:35 AM  PHQ9 SCORE ONLY  PHQ-9 Total Score 4 2 2       ASSESSMENT/PLAN:   Shoulder pain, left Discussed lidocaine patches and volteran gel for symptomatic relief. Encouraged her to continue gentle shoulder exercises to prevent frozen shoulder. Offered steroid shot, but patient politely declined. Wrote note for work release.      Glendale Chard, DO [facility]

## 2023-01-31 ENCOUNTER — Telehealth: Payer: Self-pay | Admitting: Student

## 2023-01-31 NOTE — Telephone Encounter (Signed)
Patient dropped off return to work form to be completed. Last DOS was 01/22/23. Placed in [employer].

## 2023-02-05 NOTE — Telephone Encounter (Signed)
Patient walked in requesting the document needed for her job.  Need to have completed ASAP.

## 2023-02-08 NOTE — Telephone Encounter (Signed)
Documentation placed up front for pick up.   Copy made for batch scanning.   Attempted to call patient, however no answer or identifiable voice mail.   Please let her know the document is ready.

## 2023-02-13 ENCOUNTER — Ambulatory Visit: Payer: 59 | Admitting: Podiatry

## 2023-02-13 ENCOUNTER — Ambulatory Visit (INDEPENDENT_AMBULATORY_CARE_PROVIDER_SITE_OTHER): Payer: 59

## 2023-02-13 DIAGNOSIS — M722 Plantar fascial fibromatosis: Secondary | ICD-10-CM

## 2023-02-13 MED ORDER — METHYLPREDNISOLONE 4 MG PO TBPK: ORAL_TABLET | ORAL | 0 refills | Status: AC

## 2023-02-13 NOTE — Progress Notes (Signed)
   Chief Complaint  Patient presents with   Foot Pain    Right arch pain    Subjective: 57 y.o. female presenting today for right heel pain ongoing for several weeks.  Denies a history of injury.  Gradual onset.  She has not really done anything for treatment   Past Medical History:  Diagnosis Date   Hypertension    Hyperthyroidism    Osteoarthritis      Objective: Physical Exam General: The patient is alert and oriented x3 in no acute distress.  Dermatology: Skin is warm, dry and supple bilateral lower extremities. Negative for open lesions or macerations bilateral.   Vascular: Dorsalis Pedis and Posterior Tibial pulses palpable bilateral.  Capillary fill time is immediate to all digits.  Neurological: Grossly intact via light touch  Musculoskeletal: Tenderness to palpation to the plantar aspect of the right heel along the plantar fascia. All other joints range of motion within normal limits bilateral. Strength 5/5 in all groups bilateral.   Radiographic exam RT foot 02/13/2023: Normal osseous mineralization. Joint spaces preserved. No fracture/dislocation/boney destruction. No other soft tissue abnormalities or radiopaque foreign bodies.   Assessment: 1. Plantar fasciitis right  Plan of Care:  -Patient evaluated.  X-rays reviewed -Patient declined cortisone injection -Patient has a sensitive stomach to meloxicam. -Prescription for Medrol Dosepak -Recommend Motrin 600 mg TID after completion of the Dosepak as needed -Appointment with orthotics department for custom orthotics to support the medial longitudinal arch of the foot and offload pressure from the heel and potentially alleviate her plantar fasciitis.  Order placed -Return to clinic with me as needed   Felecia Shelling, DPM [facility]  Dr. Felecia Shelling, DPM    [address] [place], [place] [address]                Office [phone]  Fax [phone]

## 2023-02-26 ENCOUNTER — Other Ambulatory Visit: Payer: 59

## 2023-04-20 ENCOUNTER — Telehealth (HOSPITAL_COMMUNITY): Payer: Self-pay | Admitting: Family Medicine

## 2023-04-20 ENCOUNTER — Ambulatory Visit (HOSPITAL_COMMUNITY)
Admission: EM | Admit: 2023-04-20 | Discharge: 2023-04-20 | Disposition: A | Discharge status: Home / Self Care | Payer: 59 | Attending: Family Medicine | Admitting: Family Medicine

## 2023-04-20 ENCOUNTER — Encounter (HOSPITAL_COMMUNITY): Payer: Self-pay

## 2023-04-20 DIAGNOSIS — M545 Low back pain, unspecified: Secondary | ICD-10-CM | POA: Diagnosis not present

## 2023-04-20 MED ORDER — IBUPROFEN 400 MG PO TABS: 400.0000 mg | ORAL_TABLET | Freq: Four times a day (QID) | ORAL | 0 refills | Status: AC | PRN

## 2023-04-20 MED ORDER — BACLOFEN 10 MG PO TABS: 10.0000 mg | ORAL_TABLET | Freq: Two times a day (BID) | ORAL | 0 refills | Status: AC | PRN

## 2023-04-20 NOTE — ED Provider Notes (Addendum)
[facility]    CSN: [id number] Arrival date & time: 04/20/23  1115      History   Chief Complaint Chief Complaint  Patient presents with   Back Pain    HPI Sonya Lin is a 57 y.o. female.   The history is provided by the patient. No language interpreter was used.  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 Currently feels normal. No blood in the urine.   Past Medical History:  Diagnosis Date   Hypertension    Hyperthyroidism    Osteoarthritis     Patient Active Problem List   Diagnosis Date Noted   Chronic right shoulder pain 12/04/2022   Hot flashes due to menopause 08/03/2022   Rectal pain 02/20/2022   Postmenopausal bleeding 11/14/2021   Menstruation, irregular 10/05/2021   URI with cough and congestion 05/23/2021   Right wrist pain 04/08/2021   Graves disease 12/18/2020   Shoulder pain, left 12/18/2020   Hypertension 12/18/2020   Contraception management 03/20/2012    Past Surgical History:  Procedure Laterality Date   AUGMENTATION MAMMAPLASTY Bilateral    BREAST BIOPSY Right 2015   BREAST SURGERY     CERVIX LESION DESTRUCTION     HYSTEROSCOPY  03/20/2012   Procedure: HYSTEROSCOPY;  Surgeon: Lavina Hamman, MD;  Location: [facility];  Service:  Gynecology;  Laterality: N/A;   IUD REMOVAL  03/20/2012   Procedure: INTRAUTERINE DEVICE (IUD) REMOVAL;  Surgeon: Lavina Hamman, MD;  Location: [facility];  Service: Gynecology;  Laterality: N/A;  Attempted Removal of Portion Intra-Uterine Device   LAPAROSCOPIC TUBAL LIGATION  03/20/2012   Procedure: LAPAROSCOPIC TUBAL LIGATION;  Surgeon: Lavina Hamman, MD;  Location: [facility];  Service: Gynecology;  Laterality: Bilateral;    OB History     Gravida  10   Para  4   Term      Preterm      AB      Living  4      SAB      IAB      Ectopic      Multiple      Live Births  4            Home Medications    Prior to Admission medications   Medication Sig Start Date End Date Taking? Authorizing Provider  baclofen (LIORESAL) 10 MG tablet Take 1 tablet (10 mg total) by mouth 2 (two) times daily as needed for up to 7 days for muscle spasms (Avoid use when driving or operating machinery). 04/20/23 04/27/23 Yes Doreene Eland, MD  ibuprofen (ADVIL) 400 MG tablet Take 1 tablet (400 mg total) by mouth every 6 (six) hours as needed. 04/20/23  Yes Janit Pagan T, MD  amLODipine (NORVASC) 10 MG tablet TAKE 1 TABLET(10 MG) BY MOUTH  DAILY 01/03/23   Jerre Simon, MD  Blood Pressure Monitoring (BLOOD PRESSURE CUFF) MISC Check blood pressure in the morning in the morning as needed 08/03/22   Cora Collum, DO  Cyanocobalamin (VITAMIN B-12 PO) Take by mouth.    [provider]  fluticasone (FLONASE) 50 MCG/ACT nasal spray Place 2 sprays into both nostrils daily. 08/17/22   Cora Collum, DO  methylPREDNISolone (MEDROL DOSEPAK) 4 MG TBPK tablet 6 day dose pack - take as directed 02/13/23   Felecia Shelling, DPM    Family History Family History  Problem Relation Age of Onset   Healthy Mother    Stroke Father    Hypertension Father    Diabetes Father     Social History Social History   Tobacco Use   Smoking status: Never   Smokeless tobacco: Never  Vaping Use    Vaping status: Never Used  Substance Use Topics   Alcohol use: No   Drug use: No     Allergies   Penicillins and Erythromycin   Review of Systems Review of Systems  Constitutional:  Negative for fever.  Genitourinary:  Negative for bladder incontinence, dysuria and pelvic pain.  Musculoskeletal:  Positive for back pain.  Neurological:  Negative for weakness, numbness and headaches.     Physical Exam Triage Vital Signs ED Triage Vitals  Encounter Vitals Group     BP 04/20/23 1231 138/87     Systolic BP Percentile --      Diastolic BP Percentile --      Pulse Rate 04/20/23 1231 65     Resp 04/20/23 1231 18     Temp 04/20/23 1231 97.8 F (36.6 C)     Temp Source 04/20/23 1231 Oral     SpO2 04/20/23 1231 98 %     Weight 04/20/23 1233 135 lb (61.2 kg)     Height --      Head Circumference --      Peak Flow --      Pain Score 04/20/23 1232 10     Pain Loc --      Pain Education --      Exclude from Growth Chart --    No data found.  Updated Vital Signs BP 138/87 (BP Location: Right Arm)   Pulse 65   Temp 97.8 F (36.6 C) (Oral)   Resp 18   Wt 61.2 kg   LMP 11/02/2021 (Exact Date)   SpO2 98%   BMI 31.38 kg/m   Visual Acuity Right Eye Distance:   Left Eye Distance:   Bilateral Distance:    Right Eye Near:   Left Eye Near:    Bilateral Near:     Physical Exam Vitals and nursing note reviewed.  Cardiovascular:     Rate and Rhythm: Normal rate and regular rhythm.     Heart sounds: Normal heart sounds. No murmur heard. Pulmonary:     Effort: Pulmonary effort is normal. No respiratory distress.     Breath sounds: Normal breath sounds. No wheezing.  Musculoskeletal:     Lumbar back: Tenderness present. No swelling or deformity. Decreased range of motion.     Comments: Slow but steady ambulation ++ left lumbar tenderness      UC Treatments / Results  Labs (all labs ordered are listed, but only abnormal results are displayed) Labs Reviewed - No data  to display  EKG   Radiology No results found.  Procedures Procedures (including critical care time)  Medications Ordered  in UC Medications - No data to display  Initial Impression / Assessment and Plan / UC Course  I have reviewed the triage vital signs and the nursing notes.  Pertinent labs & imaging results that were available during my care of the patient were reviewed by me and considered in my medical decision making (see chart for details).  Clinical Course as of 04/20/23 1316  Sat Apr 20, 2023  1252 Left lumbar pain vs flank pain Likely muscle sprain It could also be a kidney stone Given no dysuria, we are deferring urinalysis Plan to treat conservatively with NSAID and muscle relaxants Ibuprofen and Baclofen e-scribed Good and adequate hydration is encouraged Return precautions discussed Advised ED visit for CT to assess for renal stone She agreed with the plan [KE]  1314 She is normally off work Monday and Tuesday. Work letter to return next Thursday Unfortunately this was not printed during this visit. Message left for her to return to pick up. May also print via MyChart account [KE]    Clinical Course User Index [KE] Doreene Eland, MD    Final Clinical Impressions(s) / UC Diagnoses   Final diagnoses:  Acute left-sided low back pain, unspecified whether sciatica present     Discharge Instructions      It was nice seeing you. Sorry about your bac pain. This could be a muscle sprain. Kidney stones may also present similarly. However, we will treat pain medication and muscle relaxants the same way. Keep well hydrated and rest at home. Please return to the ED if your symptoms persist or worsen.      ED Prescriptions     Medication Sig Dispense Auth. Provider   ibuprofen (ADVIL) 400 MG tablet Take 1 tablet (400 mg total) by mouth every 6 (six) hours as needed. 30 tablet Doreene Eland, MD   baclofen (LIORESAL) 10 MG tablet Take 1 tablet (10 mg  total) by mouth 2 (two) times daily as needed for up to 7 days for muscle spasms (Avoid use when driving or operating machinery). 14 tablet Doreene Eland, MD      PDMP not reviewed this encounter.   Doreene Eland, MD 04/20/23 1255    Doreene Eland, MD 04/20/23 1315    Doreene Eland, MD 04/20/23 1316

## 2023-04-20 NOTE — Telephone Encounter (Signed)
Need to return to the UC to pick up work letter. She can also print this from MyChart account

## 2023-04-20 NOTE — ED Triage Notes (Addendum)
Pt presents with lower left back pain since waking up this AM. 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 I am not sure if either of those may have caused this pain." Pt currently rates her pain as a 10/10. Pt denies taking medications for her pain PTA. Pt also complaining of tingling in her left toes.   Pt restless in triage and reports she cannot bend over due to excruciating pain. Ambulatory to treatment room.

## 2023-04-20 NOTE — Discharge Instructions (Signed)
It was nice seeing you. Sorry about your bac pain. This could be a muscle sprain. Kidney stones may also present similarly. However, we will treat pain medication and muscle relaxants the same way. Keep well hydrated and rest at home. Please return to the ED if your symptoms persist or worsen.

## 2023-04-21 ENCOUNTER — Encounter: Payer: Self-pay | Admitting: Family Medicine

## 2023-04-22 ENCOUNTER — Encounter: Payer: Self-pay | Admitting: Family Medicine

## 2023-04-22 ENCOUNTER — Ambulatory Visit: Payer: 59 | Admitting: Family Medicine

## 2023-04-22 VITALS — BP 128/83 | HR 81 | Ht <= 58 in | Wt 136.0 lb

## 2023-04-22 DIAGNOSIS — M545 Low back pain, unspecified: Secondary | ICD-10-CM

## 2023-04-22 NOTE — Progress Notes (Signed)
    SUBJECTIVE:   CHIEF COMPLAINT / HPI:   Low back pain, left sided, since 11/23 Sudden onset, no recent traumatic injury Seen in urgent care 11/23, thought to have muscle sprain, treated with ibuprofen and baclofen Denies hematuria, dysuria  Works at post office Baclofen and ibuprofen helped  Denies bowel/bladder incontinence, saddle anesthesia, lower extremity weakness  PERTINENT  PMH / PSH: Hypertension  OBJECTIVE:   BP 128/83   Pulse 81   Ht 4\' 7"  (1.397 m)   Wt 136 lb (61.7 kg)   LMP 11/02/2021 (Exact Date)   SpO2 100%   BMI 31.61 kg/m   General: NAD, pleasant, able to participate in exam Respiratory: No respiratory distress Skin: warm and dry, no rashes noted Psych: Normal affect and mood  Back: No gross deformity or ecchymoses Nontender to palpation throughout cervical, thoracic, lumbar spine processes Tender to palpation left lumbar paraspinal muscles  ASSESSMENT/PLAN:   Assessment & Plan Acute left-sided low back pain without sciatica Likely MSK strain.  No red flags on history or exam, patient prefers therapy over medication.  Low suspicion for bony abnormality or radiculopathy requiring any imaging -Referral placed to PT -Continue baclofen, ibuprofen, heating pads   Vonna Drafts, MD [facility]

## 2023-04-22 NOTE — Patient Instructions (Signed)
I have placed a referral to physical therapy - you should hear from them in a couple of weeks

## 2023-04-23 ENCOUNTER — Telehealth: Payer: Self-pay | Admitting: Student

## 2023-04-23 NOTE — Telephone Encounter (Signed)
patient dropped off form at front desk for Physician or Practitioner Certification.  Verified that patient section of form has been completed.  Last DOS/WCC with PCP was 04/22/23.  Placed form in green team folder to be completed by clinical staff.  Vilinda Blanks

## 2023-04-23 NOTE — Telephone Encounter (Signed)
Reviewed work restrictions form.  Placed in PCP's box to be completed.

## 2023-04-29 ENCOUNTER — Encounter: Payer: Self-pay | Admitting: Student

## 2023-05-02 NOTE — Telephone Encounter (Signed)
Form placed up front for pick up.   Copy made for batch scanning.   Patient aware.  

## 2023-05-10 ENCOUNTER — Other Ambulatory Visit: Payer: Self-pay

## 2023-05-10 ENCOUNTER — Ambulatory Visit (INDEPENDENT_AMBULATORY_CARE_PROVIDER_SITE_OTHER): Payer: 59 | Admitting: Physical Therapy

## 2023-05-10 ENCOUNTER — Encounter: Payer: Self-pay | Admitting: Physical Therapy

## 2023-05-10 DIAGNOSIS — M62830 Muscle spasm of back: Secondary | ICD-10-CM

## 2023-05-10 DIAGNOSIS — R29898 Other symptoms and signs involving the musculoskeletal system: Secondary | ICD-10-CM | POA: Diagnosis not present

## 2023-05-10 DIAGNOSIS — M5459 Other low back pain: Secondary | ICD-10-CM

## 2023-05-10 NOTE — Therapy (Signed)
OUTPATIENT PHYSICAL THERAPY THORACOLUMBAR EVALUATION   Patient Name: Sonya Lin MRN: [id number] DOB:[birth year], 57 y.o., female Today's Date: 05/10/2023  END OF SESSION:  PT End of Session - 05/10/23 0927     Visit Number 1    Number of Visits 13    Date for PT Re-Evaluation 06/21/23    Authorization Type U[employer]    Authorization Time Period 05/10/23 to 06/21/23    PT Start Time 0848    PT Stop Time 0927    PT Time Calculation (min) 39 min    Activity Tolerance Patient tolerated treatment well    Behavior During Therapy [facility] for tasks assessed/performed             Past Medical History:  Diagnosis Date   Hypertension    Hyperthyroidism    Osteoarthritis    Past Surgical History:  Procedure Laterality Date   AUGMENTATION MAMMAPLASTY Bilateral    BREAST BIOPSY Right 2015   BREAST SURGERY     CERVIX LESION DESTRUCTION     HYSTEROSCOPY  03/20/2012   Procedure: HYSTEROSCOPY;  Surgeon: Lavina Hamman, MD;  Location: [facility];  Service: Gynecology;  Laterality: N/A;   IUD REMOVAL  03/20/2012   Procedure: INTRAUTERINE DEVICE (IUD) REMOVAL;  Surgeon: Lavina Hamman, MD;  Location: [facility];  Service: Gynecology;  Laterality: N/A;  Attempted Removal of Portion Intra-Uterine Device   LAPAROSCOPIC TUBAL LIGATION  03/20/2012   Procedure: LAPAROSCOPIC TUBAL LIGATION;  Surgeon: Lavina Hamman, MD;  Location: [facility];  Service: Gynecology;  Laterality: Bilateral;   Patient Active Problem List   Diagnosis Date Noted   Chronic right shoulder pain 12/04/2022   Hot flashes due to menopause 08/03/2022   Rectal pain 02/20/2022   Postmenopausal bleeding 11/14/2021   Menstruation, irregular 10/05/2021   URI with cough and congestion 05/23/2021   Right wrist pain 04/08/2021   Graves disease 12/18/2020   Shoulder pain, left 12/18/2020   Hypertension 12/18/2020   Contraception management 03/20/2012    PCP: Jerre Simon MD   REFERRING PROVIDER: McDiarmid, Leighton Roach, MD  REFERRING  DIAG: Diagnosis M54.50 (ICD-10-CM) - Acute left-sided low back pain without sciatica  Rationale for Evaluation and Treatment: Rehabilitation  THERAPY DIAG:  Other low back pain  Muscle spasm of back  Other symptoms and signs involving the musculoskeletal system  ONSET DATE: Around November 23rd, week before thanksgiving 2024  SUBJECTIVE:                                                                                                                                                                                           SUBJECTIVE STATEMENT:  Worked Friday  before the pain started, then got up that next Saturday to go to work again and didn't feel right. Tried taking a hot shower to try to loosen up, could barely lift leg up to get out of the shower. Sat down for awhile, then couldn't get up and had to call my grandson to get me up out of the chair. Went to urgent care, got ibuprofen and muscle relaxers, then got a heating pad. Missed 4 days of work, then went back to work (at [employer], can be very heavy) wearing a back brace that I had anyway. MD put me on work restrictions and the pain got better. After that I was playing with my grandson, I don't know what happened exactly but my back got re-injured. Have been sleeping with heating pad now, even making right turns when driving have been irritating it, I keep wearing my back brace now, its hard for me to bend over and tie my shoes. Pain is really only on the left side of my back, but its hard for me to lift my legs to tie my shoes (harder on R than L). Now I have a constant pull in my back too. When this first happened, I did have numbness and tingling in my toes but none since, just constant pain.   PERTINENT HISTORY:  See above   PAIN:  Are you having pain? No 0/10 at rest in sitting, can get to 9/10 at worst and easily irritated   PRECAUTIONS: None  RED FLAGS: None   WEIGHT BEARING RESTRICTIONS: No  FALLS:  Has patient fallen  in last 6 months? 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    PATIENT SURVEYS:  FOTO 50, predicted 68 in 10 visits   COGNITION: Overall cognitive status: Within functional limits for tasks assessed     SENSATION: Not tested  MUSCLE LENGTH:  Piriformis R severe limitation, otherwise flexibility pretty WNL   POSTURE: rounded shoulders and forward head  PALPATION: Severe mm spasms noted L lumbar paraspinals   LUMBAR ROM:   AROM eval  Flexion Severe limitation, increased pain; deferred RFIS   Extension WNL, REIS no change   Right lateral flexion Mild limitation   Left lateral flexion WNL   Right rotation   Left rotation    (Blank rows = not tested)    LOWER EXTREMITY MMT:    MMT Right eval Left eval  Hip flexion 3+ back pain  3 back pain   Hip extension    Hip abduction 4 3 back pain  Hip adduction    Hip internal rotation    Hip external rotation    Knee flexion 5 4+  Knee extension 5 4+  Ankle dorsiflexion 5 5  Ankle plantarflexion    Ankle inversion    Ankle eversion     (Blank rows = not tested)  LUMBAR SPECIAL TESTS:  Straight leg raise test: Negative    TODAY'S TREATMENT:                                                                                                                              DATE:    05/10/23  Eval, POC, HEP  Education on severe mm spasm found L paraspinals and relation to core strength and poor biomechanics in general likely re-aggravating pain, role of stress in chronic pain patterns, role of PT in addressing MSK and movement based factors contributing to pain, basic education about DN and potential benefits, tissue discomfort with mobility and strengthening exercises especially at first until strength and tissue flexibility improves   SKTC 3x3 seconds B Lumbar rotation stretch 3x3 seconds B TA set 20x5 seconds     PATIENT EDUCATION:  Education details: exam findings, HEP, POC  Person educated: Patient Education method: Programmer, multimedia, Demonstration, Verbal cues, and Handouts Education comprehension: verbalized understanding, returned demonstration, and needs further education  HOME EXERCISE PROGRAM:  Access Code: [email or web address] URL: [email or web address] Date: 05/10/2023 Prepared by: Nedra Hai  Exercises - Supine Single Knee to Chest  - 2 x daily - 7 x weekly - 1 sets - 10 reps - 3 seconds  hold - Supine Lower Trunk Rotation  - 2 x daily - 7 x weekly - 1 sets - 10 reps - 3 seconds  hold - Supine Transversus Abdominis Bracing - Hands on Stomach  - 3 x daily - 7 x weekly - 1 sets - 20 reps - 5 seconds   hold  ASSESSMENT:  CLINICAL IMPRESSION: Patient is a 57 y.o. F who was seen today for physical therapy evaluation and treatment for Diagnosis M54.50 (ICD-10-CM) - Acute left-sided low back pain without sciatica. Very pleasant but extremely talkative during evaluation thus objective measures and HEP were a bit limited today. Exam consistent with severe lumbar paraspinal mm strain or sprain, I do think that she has quite a bit of stress likely contributing to her general pain cycle. Will benefit from skilled PT services to address subjective concerns and objective findings moving forward.   OBJECTIVE IMPAIRMENTS: Abnormal gait, decreased activity  tolerance, decreased mobility, difficulty walking, decreased ROM, decreased strength, hypomobility, increased fascial restrictions, increased muscle spasms, impaired flexibility, improper body mechanics, postural dysfunction, and pain.   ACTIVITY LIMITATIONS: carrying, lifting, bending, sleeping, bed mobility, reach over head, and locomotion level  PARTICIPATION LIMITATIONS: driving, shopping, community activity, and occupation  PERSONAL FACTORS: Behavior pattern, Education, Fitness, Past/current experiences, Profession, Social background, and Time since onset of injury/illness/exacerbation are also affecting patient's functional outcome.   REHAB POTENTIAL: Fair sedentary at baseline, physically demanding job which has been re-aggravating pain  CLINICAL DECISION MAKING: Stable/uncomplicated  EVALUATION COMPLEXITY: Low   GOALS: Goals reviewed with patient? No  SHORT TERM GOALS: Target date: 05/31/2023    Will be compliant with appropriate progressive HEP  Baseline: Goal status: INITIAL  2.  Lumbar flexion to be unrestricted and pain  free  Baseline:  Goal status: INITIAL  3.  Will demonstrate good awareness of functional posture and biomechanics with cues/postural aides PRN  Baseline:  Goal status: INITIAL   LONG TERM GOALS: Target date: 06/21/2023    MMT to have improved by at least one grade in all weak groups  Baseline:  Goal status: INITIAL  2.  L lumbar paraspinal spasm to have improved by at least 75%  Baseline:  Goal status: INITIAL  3.  Will demonstrate good biomechanics for work simulated tasks to reduce risk of re-injury and pain re-aggravation  Baseline:  Goal status: INITIAL  4.  Pain to be no more than 3/10 at worst  Baseline:  Goal status: INITIAL  5.  FOTO score to be within 5 points of predicted value by time of DC  Baseline:  Goal status: INITIAL    PLAN:  PT FREQUENCY: 2x/week  PT DURATION: 6 weeks  PLANNED INTERVENTIONS: 97164- PT  Re-evaluation, 97110-Therapeutic exercises, 97530- Therapeutic activity, [id number]- Neuromuscular re-education, 97535- Self Care, 62952- Manual therapy, [id number]- Aquatic Therapy, 97014- Electrical stimulation (unattended), [id number]- Traction (mechanical), [id number]- Ionotophoresis 4mg /ml Dexamethasone, Taping, Dry Needling, Cryotherapy, and Moist heat.  PLAN FOR NEXT SESSION: lumbar and hip ROM, core/hip strength, biomechanics, manual as desired/dry needling as appropriate   Nedra Hai, PT, DPT 05/10/23 9:41 AM

## 2023-05-24 ENCOUNTER — Ambulatory Visit (INDEPENDENT_AMBULATORY_CARE_PROVIDER_SITE_OTHER): Payer: 59 | Admitting: Physical Therapy

## 2023-05-24 ENCOUNTER — Encounter: Payer: Self-pay | Admitting: Physical Therapy

## 2023-05-24 DIAGNOSIS — M62830 Muscle spasm of back: Secondary | ICD-10-CM

## 2023-05-24 DIAGNOSIS — M5459 Other low back pain: Secondary | ICD-10-CM

## 2023-05-24 DIAGNOSIS — R29898 Other symptoms and signs involving the musculoskeletal system: Secondary | ICD-10-CM

## 2023-05-24 NOTE — Therapy (Signed)
OUTPATIENT PHYSICAL THERAPY THORACOLUMBAR TREATMENT   Patient Name: Sonya Lin MRN: [id number] DOB:[birth year], 57 y.o., female Today's Date: 05/24/2023  END OF SESSION:  PT End of Session - 05/24/23 0811     Visit Number 2    Number of Visits 13    Date for PT Re-Evaluation 06/21/23    Authorization Type U[employer]    Authorization Time Period 05/10/23 to 06/21/23    PT Start Time 0803    PT Stop Time 0841    PT Time Calculation (min) 38 min    Activity Tolerance Patient tolerated treatment well    Behavior During Therapy [facility] for tasks assessed/performed              Past Medical History:  Diagnosis Date   Hypertension    Hyperthyroidism    Osteoarthritis    Past Surgical History:  Procedure Laterality Date   AUGMENTATION MAMMAPLASTY Bilateral    BREAST BIOPSY Right 2015   BREAST SURGERY     CERVIX LESION DESTRUCTION     HYSTEROSCOPY  03/20/2012   Procedure: HYSTEROSCOPY;  Surgeon: Lavina Hamman, MD;  Location: [facility];  Service: Gynecology;  Laterality: N/A;   IUD REMOVAL  03/20/2012   Procedure: INTRAUTERINE DEVICE (IUD) REMOVAL;  Surgeon: Lavina Hamman, MD;  Location: [facility];  Service: Gynecology;  Laterality: N/A;  Attempted Removal of Portion Intra-Uterine Device   LAPAROSCOPIC TUBAL LIGATION  03/20/2012   Procedure: LAPAROSCOPIC TUBAL LIGATION;  Surgeon: Lavina Hamman, MD;  Location: [facility];  Service: Gynecology;  Laterality: Bilateral;   Patient Active Problem List   Diagnosis Date Noted   Chronic right shoulder pain 12/04/2022   Hot flashes due to menopause 08/03/2022   Rectal pain 02/20/2022   Postmenopausal bleeding 11/14/2021   Menstruation, irregular 10/05/2021   URI with cough and congestion 05/23/2021   Right wrist pain 04/08/2021   Graves disease 12/18/2020   Shoulder pain, left 12/18/2020   Hypertension 12/18/2020   Contraception management 03/20/2012    PCP: Jerre Simon MD   REFERRING PROVIDER: McDiarmid, Leighton Roach, MD  REFERRING  DIAG: Diagnosis M54.50 (ICD-10-CM) - Acute left-sided low back pain without sciatica  Rationale for Evaluation and Treatment: Rehabilitation  THERAPY DIAG:  Other low back pain  Other symptoms and signs involving the musculoskeletal system  Muscle spasm of back  ONSET DATE: Around November 23rd, week before thanksgiving 2024  SUBJECTIVE:                                                                                                                                                                                           SUBJECTIVE STATEMENT:  Feeling  a little better, I can move but its more of a problem when I'm not moving around like sitting to watch TV or sleeping. Sleeping is still fairly uncomfortable. Not at the job where I have to reach overhead a lot, everything is at chest level now     EVAL: Worked Friday before the pain started, then got up that next Saturday to go to work again and didn't feel right. Tried taking a hot shower to try to loosen up, could barely lift leg up to get out of the shower. Sat down for awhile, then couldn't get up and had to call my grandson to get me up out of the chair. Went to urgent care, got ibuprofen and muscle relaxers, then got a heating pad. Missed 4 days of work, then went back to work (at [employer] office, can be very heavy) wearing a back brace that I had anyway. MD put me on work restrictions and the pain got better. After that I was playing with my grandson, I don't know what happened exactly but my back got re-injured. Have been sleeping with heating pad now, even making right turns when driving have been irritating it, I keep wearing my back brace now, its hard for me to bend over and tie my shoes. Pain is really only on the left side of my back, but its hard for me to lift my legs to tie my shoes (harder on R than L). Now I have a constant pull in my back too. When this first happened, I did have numbness and tingling in my toes but none since, just  constant pain.   PERTINENT HISTORY:  See above   PAIN:  Are you having pain? Yes: NPRS scale: 4/10, at worst over past week 7/10 Pain location: almost across lower back and into hips  Pain description: dull  Aggravating factors: sitting still  Relieving factors: movement     PRECAUTIONS: None  RED FLAGS: None   WEIGHT BEARING RESTRICTIONS: No  FALLS:  Has patient fallen in last 6 months? 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    PATIENT SURVEYS:  FOTO 50, predicted 68 in 10 visits   COGNITION: Overall cognitive status: Within functional limits for tasks assessed     SENSATION: Not tested  MUSCLE LENGTH:  Piriformis R severe limitation, otherwise flexibility pretty WNL   POSTURE: rounded shoulders and forward head  PALPATION: Severe mm spasms noted L lumbar paraspinals   LUMBAR ROM:   AROM eval  Flexion Severe limitation, increased pain; deferred RFIS   Extension WNL, REIS no change   Right lateral flexion Mild limitation   Left lateral flexion WNL   Right rotation   Left rotation    (Blank rows = not tested)    LOWER EXTREMITY MMT:    MMT Right eval Left eval  Hip flexion 3+ back pain  3 back  pain   Hip extension    Hip abduction 4 3 back pain  Hip adduction    Hip internal rotation    Hip external  rotation    Knee flexion 5 4+  Knee extension 5 4+  Ankle dorsiflexion 5 5  Ankle plantarflexion    Ankle inversion    Ankle eversion     (Blank rows = not tested)  LUMBAR SPECIAL TESTS:  Straight leg raise test: Negative    TODAY'S TREATMENT:                                                                                                                              DATE:    05/24/23  TherEx  Nustep L3 x8 minutes BLEs only for w/u, tissue perfusion, mm endurance  Bridges + ABD into red TB x10  PPT 12x3 second holds  Sidelying clams red TB x12 B  Figure 4 piriformis stretch supine 2x30 seconds B         05/10/23  Eval, POC, HEP  Education on severe mm spasm found L paraspinals and relation to core strength and poor biomechanics in general likely re-aggravating pain, role of stress in chronic pain patterns, role of PT in addressing MSK and movement based factors contributing to pain, basic education about DN and potential benefits, tissue discomfort with mobility and strengthening exercises especially at first until strength and tissue flexibility improves   SKTC 3x3 seconds B Lumbar rotation stretch 3x3 seconds B TA set 20x5 seconds     PATIENT EDUCATION:  Education details: exam findings, HEP, POC  Person educated: Patient Education method: Programmer, multimedia, Demonstration, Verbal cues, and Handouts Education comprehension: verbalized understanding, returned demonstration, and needs further education  HOME EXERCISE PROGRAM:  Access Code: [email or web address] URL: [email or web address] Date: 05/24/2023 Prepared by: Nedra Hai  Exercises - Supine Single Knee to Chest  - 2 x daily - 7 x weekly - 1 sets - 10 reps - 3 seconds  hold - Supine Lower Trunk Rotation  - 2 x daily - 7 x weekly - 1 sets - 10 reps - 3 seconds  hold - Supine Transversus Abdominis Bracing - Hands on Stomach  - 3 x daily - 7 x weekly - 1 sets - 20 reps - 5 seconds   hold - Supine Figure 4  Piriformis Stretch  - 2 x daily - 7 x weekly - 1 sets - 3 reps - 30 seconds  hold  ASSESSMENT:  CLINICAL IMPRESSION:  Pt arrives today feeling better, still having some pain especially in static positions. Worked on general LE strength and core strength today as per POC, tolerated all interventions well but did need a bit of extended time to complete sets today due to general low level of physical conditioning, no increase in pain at EOS however. Pain has moved to both sides of her low back instead of being on just one side but per her description today does not sound  radicular at this point- will monitor and advised to her inform PT if the pain ever does start creeping further down LEs so that we can assess and address.  Will continue to progress as able/tolerated. Encouraged up and moving every 10-15 minutes when watching a movie or on every commercial break if watching regular TV.    EVAL: Patient is a 57 y.o. F who was seen today for physical therapy evaluation and treatment for Diagnosis M54.50 (ICD-10-CM) - Acute left-sided low back pain without sciatica. Very pleasant but extremely talkative during evaluation thus objective measures and HEP were a bit limited today. Exam consistent with severe lumbar paraspinal mm strain or sprain, I do think that she has quite a bit of stress likely contributing to her general pain cycle. Will benefit from skilled PT services to address subjective concerns and objective findings moving forward.   OBJECTIVE IMPAIRMENTS: Abnormal gait, decreased activity tolerance, decreased mobility, difficulty walking, decreased ROM, decreased strength, hypomobility, increased fascial restrictions, increased muscle spasms, impaired flexibility, improper body mechanics, postural dysfunction, and pain.   ACTIVITY LIMITATIONS: carrying, lifting, bending, sleeping, bed mobility, reach over head, and locomotion level  PARTICIPATION LIMITATIONS: driving, shopping, community  activity, and occupation  PERSONAL FACTORS: Behavior pattern, Education, Fitness, Past/current experiences, Profession, Social background, and Time since onset of injury/illness/exacerbation are also affecting patient's functional outcome.   REHAB POTENTIAL: Fair sedentary at baseline, physically demanding job which has been re-aggravating pain  CLINICAL DECISION MAKING: Stable/uncomplicated  EVALUATION COMPLEXITY: Low   GOALS: Goals reviewed with patient? No  SHORT TERM GOALS: Target date: 05/31/2023    Will be compliant with appropriate progressive HEP  Baseline: Goal status: INITIAL  2.  Lumbar flexion to be unrestricted and pain free  Baseline:  Goal status: INITIAL  3.  Will demonstrate good awareness of functional posture and biomechanics with cues/postural aides PRN  Baseline:  Goal status: INITIAL   LONG TERM GOALS: Target date: 06/21/2023    MMT to have improved by at least one grade in all weak groups  Baseline:  Goal status: INITIAL  2.  L lumbar paraspinal spasm to have improved by at least 75%  Baseline:  Goal status: INITIAL  3.  Will demonstrate good biomechanics for work simulated tasks to reduce risk of re-injury and pain re-aggravation  Baseline:  Goal status: INITIAL  4.  Pain to be no more than 3/10 at worst  Baseline:  Goal status: INITIAL  5.  FOTO score to be within 5 points of predicted value by time of DC  Baseline:  Goal status: INITIAL    PLAN:  PT FREQUENCY: 2x/week  PT DURATION: 6 weeks  PLANNED INTERVENTIONS: 97164- PT Re-evaluation, 97110-Therapeutic exercises, 97530- Therapeutic activity, [id number]- Neuromuscular re-education, 97535- Self Care, 16109- Manual therapy, [id number]- Aquatic Therapy, 97014- Electrical stimulation (unattended), [id number]- Traction (mechanical), [id number]- Ionotophoresis 4mg /ml Dexamethasone, Taping, Dry Needling, Cryotherapy, and Moist heat.  PLAN FOR NEXT SESSION: lumbar and hip ROM, core/hip strength,  biomechanics, manual as desired/dry needling as appropriate, work on general physical conditioning as well and monitor for radicular sx   Nedra Hai, PT, DPT 05/24/23 8:41 AM

## 2023-05-28 ENCOUNTER — Ambulatory Visit (INDEPENDENT_AMBULATORY_CARE_PROVIDER_SITE_OTHER): Payer: 59 | Admitting: Physical Therapy

## 2023-05-28 ENCOUNTER — Encounter: Payer: Self-pay | Admitting: Physical Therapy

## 2023-05-28 DIAGNOSIS — M5459 Other low back pain: Secondary | ICD-10-CM

## 2023-05-28 DIAGNOSIS — M62830 Muscle spasm of back: Secondary | ICD-10-CM

## 2023-05-28 DIAGNOSIS — R29898 Other symptoms and signs involving the musculoskeletal system: Secondary | ICD-10-CM

## 2023-05-28 NOTE — Therapy (Signed)
 OUTPATIENT PHYSICAL THERAPY THORACOLUMBAR TREATMENT   Patient Name: Sonya Lin MRN: [id number] DOB:1966/04/03, 57 y.o., female Today's Date: 05/28/2023  END OF SESSION:  PT End of Session - 05/28/23 0846     Visit Number 3    Number of Visits 13    Date for PT Re-Evaluation 06/21/23    Authorization Type [employer]    Authorization Time Period 05/10/23 to 06/21/23    PT Start Time 0846    PT Stop Time 0926    PT Time Calculation (min) 40 min    Activity Tolerance Patient tolerated treatment well    Behavior During Therapy Anxious               Past Medical History:  Diagnosis Date   Hypertension    Hyperthyroidism    Osteoarthritis    Past Surgical History:  Procedure Laterality Date   AUGMENTATION MAMMAPLASTY Bilateral    BREAST BIOPSY Right 2015   BREAST SURGERY     CERVIX LESION DESTRUCTION     HYSTEROSCOPY  03/20/2012   Procedure: HYSTEROSCOPY;  Surgeon: Krystal Deaner, MD;  Location: [facility];  Service: Gynecology;  Laterality: N/A;   IUD REMOVAL  03/20/2012   Procedure: INTRAUTERINE DEVICE (IUD) REMOVAL;  Surgeon: Krystal Deaner, MD;  Location: [facility];  Service: Gynecology;  Laterality: N/A;  Attempted Removal of Portion Intra-Uterine Device   LAPAROSCOPIC TUBAL LIGATION  03/20/2012   Procedure: LAPAROSCOPIC TUBAL LIGATION;  Surgeon: Krystal Deaner, MD;  Location: [facility];  Service: Gynecology;  Laterality: Bilateral;   Patient Active Problem List   Diagnosis Date Noted   Chronic right shoulder pain 12/04/2022   Hot flashes due to menopause 08/03/2022   Rectal pain 02/20/2022   Postmenopausal bleeding 11/14/2021   Menstruation, irregular 10/05/2021   URI with cough and congestion 05/23/2021   Right wrist pain 04/08/2021   Graves disease 12/18/2020   Shoulder pain, left 12/18/2020   Hypertension 12/18/2020   Contraception management 03/20/2012    PCP: Rosendo Rush MD   REFERRING PROVIDER: McDiarmid, Krystal BIRCH, MD  REFERRING DIAG: Diagnosis M54.50  (ICD-10-CM) - Acute left-sided low back pain without sciatica  Rationale for Evaluation and Treatment: Rehabilitation  THERAPY DIAG:  Other low back pain  Other symptoms and signs involving the musculoskeletal system  Muscle spasm of back  ONSET DATE: Around November 23rd, week before thanksgiving 2024  SUBJECTIVE:                                                                                                                                                                                           SUBJECTIVE STATEMENT:  Nothing really new  since last time, still having trouble getting comfortable in terms of sleep. Pain is 6-7 now, I think because of having issues getting comfortable to sleep. Job is still very physical and have to be able to manage awkwardly shaped and balanced packages. I just feel out of alignment when I'm sleeping and I try to use pillows to correct it but I just can't get comfortable.     EVAL: Worked Friday before the pain started, then got up that next Saturday to go to work again and didn't feel right. Tried taking a hot shower to try to loosen up, could barely lift leg up to get out of the shower. Sat down for awhile, then couldn't get up and had to call my grandson to get me up out of the chair. Went to urgent care, got ibuprofen  and muscle relaxers, then got a heating pad. Missed 4 days of work, then went back to work (at post [employer], can be very heavy) wearing a back brace that I had anyway. MD put me on work restrictions and the pain got better. After that I was playing with my grandson, I don't know what happened exactly but my back got re-injured. Have been sleeping with heating pad now, even making right turns when driving have been irritating it, I keep wearing my back brace now, its hard for me to bend over and tie my shoes. Pain is really only on the left side of my back, but its hard for me to lift my legs to tie my shoes (harder on R than L). Now I have a  constant pull in my back too. When this first happened, I did have numbness and tingling in my toes but none since, just constant pain.   PERTINENT HISTORY:  See above   PAIN:  Are you having pain? Yes: NPRS scale: 6-7/10 now Pain location: almost across lower back and into hips  Pain description: dull, tender  Aggravating factors: sitting still  Relieving factors: movement     PRECAUTIONS: None  RED FLAGS: None   WEIGHT BEARING RESTRICTIONS: No  FALLS:  Has patient fallen in last 6 months? 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    PATIENT SURVEYS:  FOTO 50, predicted 68 in 10 visits   COGNITION: Overall cognitive status: Within functional limits for tasks assessed     SENSATION: Not tested  MUSCLE LENGTH:  Piriformis R severe limitation, otherwise flexibility pretty WNL   POSTURE: rounded shoulders and forward head  PALPATION: Severe mm spasms noted L lumbar paraspinals   LUMBAR ROM:   AROM eval  Flexion Severe limitation, increased pain; deferred RFIS   Extension WNL, REIS no change   Right lateral flexion Mild limitation   Left lateral flexion WNL   Right rotation   Left rotation    (Blank rows = not tested)    LOWER EXTREMITY MMT:  MMT Right eval Left eval  Hip flexion 3+ back pain  3 back pain   Hip  extension    Hip abduction 4 3 back pain  Hip adduction    Hip internal rotation    Hip external rotation    Knee flexion 5 4+  Knee extension 5 4+  Ankle dorsiflexion 5 5  Ankle plantarflexion    Ankle inversion    Ankle eversion     (Blank rows = not tested)  LUMBAR SPECIAL TESTS:  Straight leg raise test: Negative    TODAY'S TREATMENT:                                                                                                                              DATE:   05/28/23  TherEx  Nustep L3 x10 minutes BLEs only for w/u, conditioning, tissue perfusion QL stretch with stool 3x30 seconds (3 way stretch, 1 time each direction) Bridges x12 HS stretches 2x30 seconds hooklying with strap  SI MET (L LE significantly longer than R), corrected 90%   MHP under lumbar spine x8 minutes for some exercises in hooklying:   - TA sets 15x5 second holds  - figure 4 piriformis stretch 2x30 seconds B     05/24/23  TherEx  Nustep L3 x8 minutes BLEs only for w/u, tissue perfusion, mm endurance  Bridges + ABD into red TB x10  PPT 12x3 second holds  Sidelying clams red TB x12 B  Figure 4 piriformis stretch supine 2x30 seconds B         05/10/23  Eval, POC, HEP  Education on severe mm spasm found L paraspinals and relation to core strength and poor biomechanics in general likely re-aggravating pain, role of stress in chronic pain patterns, role of PT in addressing MSK and movement based factors contributing to pain, basic education about DN and potential benefits, tissue discomfort with mobility and strengthening exercises especially at first until strength and tissue flexibility improves   SKTC 3x3 seconds B Lumbar rotation stretch 3x3 seconds B TA set 20x5 seconds     PATIENT EDUCATION:  Education details: exam findings, HEP, POC  Person educated: Patient Education method: Programmer, Multimedia, Demonstration, Verbal cues, and Handouts Education comprehension: verbalized  understanding, returned demonstration, and needs further education  HOME EXERCISE PROGRAM:  Access Code: [email or web address] URL: [email or web address] Date: 05/24/2023 Prepared by: Josette Rough  Exercises - Supine Single Knee to Chest  - 2 x daily - 7 x weekly - 1 sets - 10 reps - 3 seconds  hold - Supine Lower Trunk Rotation  - 2 x daily - 7 x weekly - 1 sets - 10 reps - 3 seconds  hold - Supine Transversus Abdominis Bracing - Hands on Stomach  - 3 x daily - 7 x weekly - 1 sets - 20 reps - 5 seconds   hold - Supine Figure 4 Piriformis Stretch  - 2 x daily - 7 x weekly - 1 sets -  3 reps - 30 seconds  hold  ASSESSMENT:  CLINICAL IMPRESSION:  Pt arrives today doing OK, still having similar pain patterns. We continued working on lumbar mobility, proximal strength, and core today as per POC. Still very easily fatigued. Will progress as able/tolerated. Became a bit emotional with more challenging exercises such as QL stretching today, needed education to not push stretches/exercises into acute pain and reassurance of benefit of exercise. Tried some moist heat to lumbar paraspinals to increase tolerability of table exercises this session.Encouraged activity outside of PT as able to help improve conditioning and overall health, she reports having difficulty with motivation regarding exercise classes.    EVAL: Patient is a 57 y.o. F who was seen today for physical therapy evaluation and treatment for Diagnosis M54.50 (ICD-10-CM) - Acute left-sided low back pain without sciatica. Very pleasant but extremely talkative during evaluation thus objective measures and HEP were a bit limited today. Exam consistent with severe lumbar paraspinal mm strain or sprain, I do think that she has quite a bit of stress likely contributing to her general pain cycle. Will benefit from skilled PT services to address subjective concerns and objective findings moving forward.   OBJECTIVE IMPAIRMENTS: Abnormal gait,  decreased activity tolerance, decreased mobility, difficulty walking, decreased ROM, decreased strength, hypomobility, increased fascial restrictions, increased muscle spasms, impaired flexibility, improper body mechanics, postural dysfunction, and pain.   ACTIVITY LIMITATIONS: carrying, lifting, bending, sleeping, bed mobility, reach over head, and locomotion level  PARTICIPATION LIMITATIONS: driving, shopping, community activity, and occupation  PERSONAL FACTORS: Behavior pattern, Education, Fitness, Past/current experiences, Profession, Social background, and Time since onset of injury/illness/exacerbation are also affecting patient's functional outcome.   REHAB POTENTIAL: Fair sedentary at baseline, physically demanding job which has been re-aggravating pain  CLINICAL DECISION MAKING: Stable/uncomplicated  EVALUATION COMPLEXITY: Low   GOALS: Goals reviewed with patient? No  SHORT TERM GOALS: Target date: 05/31/2023    Will be compliant with appropriate progressive HEP  Baseline: Goal status: INITIAL  2.  Lumbar flexion to be unrestricted and pain free  Baseline:  Goal status: INITIAL  3.  Will demonstrate good awareness of functional posture and biomechanics with cues/postural aides PRN  Baseline:  Goal status: INITIAL   LONG TERM GOALS: Target date: 06/21/2023    MMT to have improved by at least one grade in all weak groups  Baseline:  Goal status: INITIAL  2.  L lumbar paraspinal spasm to have improved by at least 75%  Baseline:  Goal status: INITIAL  3.  Will demonstrate good biomechanics for work simulated tasks to reduce risk of re-injury and pain re-aggravation  Baseline:  Goal status: INITIAL  4.  Pain to be no more than 3/10 at worst  Baseline:  Goal status: INITIAL  5.  FOTO score to be within 5 points of predicted value by time of DC  Baseline:  Goal status: INITIAL    PLAN:  PT FREQUENCY: 2x/week  PT DURATION: 6 weeks  PLANNED  INTERVENTIONS: 97164- PT Re-evaluation, 97110-Therapeutic exercises, 97530- Therapeutic activity, [id number]- Neuromuscular re-education, 97535- Self Care, [id number]- Manual therapy, [id number]- Aquatic Therapy, 97014- Electrical stimulation (unattended), [id number]- Traction (mechanical), [id number]- Ionotophoresis 4mg /ml Dexamethasone , Taping, Dry Needling, Cryotherapy, and Moist heat.  PLAN FOR NEXT SESSION: lumbar and hip ROM, core/hip strength, biomechanics, manual as desired/dry needling as appropriate, work on general physical conditioning as well and monitor for radicular sx. Consider TENS?   Josette Rough, PT, DPT 05/28/23 9:30 AM

## 2023-05-31 ENCOUNTER — Encounter: Payer: 59 | Admitting: Physical Therapy

## 2023-06-04 ENCOUNTER — Encounter: Payer: 59 | Admitting: Physical Therapy

## 2023-06-06 ENCOUNTER — Encounter: Payer: 59 | Admitting: Physical Therapy

## 2023-06-11 ENCOUNTER — Encounter: Payer: 59 | Admitting: Physical Therapy

## 2023-06-13 ENCOUNTER — Encounter: Payer: 59 | Admitting: Physical Therapy

## 2023-06-18 ENCOUNTER — Telehealth: Payer: Self-pay | Admitting: Physical Therapy

## 2023-06-18 ENCOUNTER — Encounter: Payer: 59 | Admitting: Physical Therapy

## 2023-06-18 NOTE — Telephone Encounter (Signed)
Pt did not show for PT appointment today. They were contacted and informed of this via voicemail. They were provided the date and time of their next appointment on voicemail. They were instructed to call us to let us know if they cannot make their appointment or if they no longer need PT.   Ivery Quale, PT, DPT 06/18/23 9:10 AM

## 2023-06-20 ENCOUNTER — Encounter: Payer: 59 | Admitting: Physical Therapy

## 2023-06-27 ENCOUNTER — Encounter: Payer: Self-pay | Admitting: Physical Therapy

## 2023-06-27 NOTE — Therapy (Signed)
PHYSICAL THERAPY DISCHARGE SUMMARY  Visits from Start of Care: 3  Current functional level related to goals / functional outcomes: Has not been active with PT for 30 days or greater, DC per [facility] policy    Remaining deficits: Unable to assess    Education / Equipment: N/A    Patient agrees to discharge. Patient goals were not met. Patient is being discharged due to not returning since the last visit.   Nedra Hai, PT, DPT 06/27/23 9:55 AM

## 2023-07-18 ENCOUNTER — Other Ambulatory Visit: Payer: Self-pay | Admitting: Student

## 2023-07-18 DIAGNOSIS — I10 Essential (primary) hypertension: Secondary | ICD-10-CM

## 2023-07-26 ENCOUNTER — Ambulatory Visit (INDEPENDENT_AMBULATORY_CARE_PROVIDER_SITE_OTHER): Payer: Self-pay | Admitting: Student

## 2023-07-26 VITALS — BP 122/80 | HR 71 | Ht <= 58 in | Wt 133.8 lb

## 2023-07-26 DIAGNOSIS — M545 Low back pain, unspecified: Secondary | ICD-10-CM | POA: Diagnosis not present

## 2023-07-26 DIAGNOSIS — Z1231 Encounter for screening mammogram for malignant neoplasm of breast: Secondary | ICD-10-CM

## 2023-07-26 DIAGNOSIS — Z1211 Encounter for screening for malignant neoplasm of colon: Secondary | ICD-10-CM | POA: Diagnosis not present

## 2023-07-26 NOTE — Progress Notes (Signed)
    SUBJECTIVE:   CHIEF COMPLAINT / HPI:   58 year old female presenting today for lower back and shoulder pain follow-up.  Been seen PT for the last few weeks and reports improvement in lower back pain and shoulder pain.  Restrictions and PT has helped improve her pain significantly.  Currently she is doing Tylenol.  Recently stopped PT due to insurance not covering sessions and due to cost.  However she is continuing home stretching exercises recommended by PT.  There are no concerns of lower extremity weakness, pelvic numbness or bowel/urinary incontinence.  Per his desire to stop NSAID use and try supplements such as Tumeric and an L-Glutamine online supplements recommended by her sister.  PERTINENT  PMH / PSH: Reviewed  OBJECTIVE:   BP 122/80   Pulse 71   Ht 4\' 7"  (1.397 m)   Wt 133 lb 12.8 oz (60.7 kg)   LMP 11/02/2021 (Exact Date)   SpO2 97%   BMI 31.10 kg/m    Physical Exam General: Alert, well appearing, NAD Cardiovascular: RRR, No Murmurs, Normal S2/S2 Respiratory: CTAB, No wheezing or Rales Extremities: Normal strength on all extremities with sensations intact. MSK: No notable back deformity, mild tenderness on palpation across lower back.   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 Pain has since improved with work restrictions, PT and analgesic. No Flag symptoms such as saddle paresthesia or lower extremity weakness. -Continue as needed Tylenol -Encouraged to continue back exercises as instructed by PT - Advised on proper lifting and bending techique   Colon cancer screening Colonoscopy completed in [place].  Unable to find record. -Placed GI referral for colonoscopy.  Breast cancer screening Mammogram in 2022 required diagnostic mammogram for the right breast which eventually showed benign-appearing calcifications in the right breast.  At the time patient was  recommended to have a follow-up mammogram in a year. -Ordered screening mammogram as recommended by radiology readings from past mammogram. Jerre Simon, MD [facility]

## 2023-07-26 NOTE — Patient Instructions (Signed)
 Glad to hear that your back pain has improved with exercise and activity.  These make sure to continue the exercise recommended by PT.  You are currently due for mammogram for breast cancer screening and colonoscopy for colon cancer screening.  I have put in orders for mammogram   Referral to gastroenterologist have been placed for your colonoscopy.  They will call you to schedule an appointment.

## 2023-07-26 NOTE — Assessment & Plan Note (Signed)
 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 Pain has since improved with work restrictions, PT and analgesic. No Flag symptoms such as saddle paresthesia or lower extremity weakness. -Continue as needed Tylenol -Encouraged to continue back exercises as instructed by PT - Advised on proper lifting and bending techique

## 2023-08-09 ENCOUNTER — Ambulatory Visit
Admission: RE | Admit: 2023-08-09 | Discharge: 2023-08-09 | Disposition: A | Discharge status: Home / Self Care | Payer: Self-pay | Source: Ambulatory Visit | Attending: Family Medicine | Admitting: Family Medicine

## 2023-08-09 DIAGNOSIS — Z1231 Encounter for screening mammogram for malignant neoplasm of breast: Secondary | ICD-10-CM

## 2023-08-15 ENCOUNTER — Encounter: Payer: Self-pay | Admitting: Student

## 2023-08-21 ENCOUNTER — Encounter: Payer: Self-pay | Admitting: Nurse Practitioner

## 2023-10-14 ENCOUNTER — Other Ambulatory Visit (INDEPENDENT_AMBULATORY_CARE_PROVIDER_SITE_OTHER)

## 2023-10-14 ENCOUNTER — Encounter: Payer: Self-pay | Admitting: Nurse Practitioner

## 2023-10-14 ENCOUNTER — Ambulatory Visit (INDEPENDENT_AMBULATORY_CARE_PROVIDER_SITE_OTHER): Admitting: Nurse Practitioner

## 2023-10-14 VITALS — BP 116/74 | Ht <= 58 in | Wt 135.8 lb

## 2023-10-14 DIAGNOSIS — R159 Full incontinence of feces: Secondary | ICD-10-CM

## 2023-10-14 DIAGNOSIS — K5900 Constipation, unspecified: Secondary | ICD-10-CM

## 2023-10-14 DIAGNOSIS — R194 Change in bowel habit: Secondary | ICD-10-CM

## 2023-10-14 LAB — COMPREHENSIVE METABOLIC PANEL WITH GFR
ALT: 23 U/L (ref 0–35)
AST: 27 U/L (ref 0–37)
Albumin: 4.2 g/dL (ref 3.5–5.2)
Alkaline Phosphatase: 118 U/L — ABNORMAL HIGH (ref 39–117)
BUN: 13 mg/dL (ref 6–23)
CO2: 30 meq/L (ref 19–32)
Calcium: 9.8 mg/dL (ref 8.4–10.5)
Chloride: 105 meq/L (ref 96–112)
Creatinine, Ser: 0.76 mg/dL (ref 0.40–1.20)
GFR: 86.98 mL/min (ref >60.00)
Glucose, Bld: 85 mg/dL (ref 70–99)
Potassium: 3.8 meq/L (ref 3.5–5.1)
Sodium: 140 meq/L (ref 135–145)
Total Bilirubin: 0.8 mg/dL (ref 0.2–1.2)
Total Protein: 7.5 g/dL (ref 6.0–8.3)

## 2023-10-14 LAB — CBC WITH DIFFERENTIAL/PLATELET
Basophils Absolute: 0 10*3/uL (ref 0.0–0.1)
Basophils Relative: 0.6 % (ref 0.0–3.0)
Eosinophils Absolute: 0.1 10*3/uL (ref 0.0–0.7)
Eosinophils Relative: 2.8 % (ref 0.0–5.0)
HCT: 45.3 % (ref 36.0–46.0)
Hemoglobin: 15.2 g/dL — ABNORMAL HIGH (ref 12.0–15.0)
Lymphocytes Relative: 39 % (ref 12.0–46.0)
Lymphs Abs: 2 10*3/uL (ref 0.7–4.0)
MCHC: 33.5 g/dL (ref 30.0–36.0)
MCV: 89.4 fl (ref 78.0–100.0)
Monocytes Absolute: 0.4 10*3/uL (ref 0.1–1.0)
Monocytes Relative: 7.1 % (ref 3.0–12.0)
Neutro Abs: 2.6 10*3/uL (ref 1.4–7.7)
Neutrophils Relative %: 50.5 % (ref 43.0–77.0)
Platelets: 214 10*3/uL (ref 150.0–400.0)
RBC: 5.07 Mil/uL (ref 3.87–5.11)
RDW: 13.4 % (ref 11.5–15.5)
WBC: 5.2 10*3/uL (ref 4.0–10.5)

## 2023-10-14 MED ORDER — NA SULFATE-K SULFATE-MG SULF 17.5-3.13-1.6 GM/177ML PO SOLN: 1.0000 | Freq: Once | ORAL | 0 refills | Status: AC

## 2023-10-14 NOTE — Patient Instructions (Addendum)
 Your provider has requested that you go to the basement level for lab work before leaving today. Press "B" on the elevator. The lab is located at the first door on the left as you exit the elevator.  Benefiber one tablespoon daily  Take Miralax 1 capful mixed in 8 ounces of water at bed time for constipation as tolerated.  Drink 8 glasses of water daily   You have been scheduled for a colonoscopy. Please follow written instructions given to you at your visit today.   If you use inhalers (even only as needed), please bring them with you on the day of your procedure.  DO NOT TAKE 7 DAYS PRIOR TO TEST- Trulicity (dulaglutide) Ozempic, Wegovy (semaglutide) Mounjaro (tirzepatide) Bydureon Bcise (exanatide extended release)  DO NOT TAKE 1 DAY PRIOR TO YOUR TEST Rybelsus (semaglutide) Adlyxin (lixisenatide) Victoza (liraglutide) Byetta (exanatide) ___________________________________________________________________________  _______________________________________________________  If your blood pressure at your visit was 140/90 or greater, please contact your primary care physician to follow up on this.  _______________________________________________________  If you are age 43 or older, your body mass index should be between 23-30. Your Body mass index is 31.56 kg/m. If this is out of the aforementioned range listed, please consider follow up with your Primary Care Provider.  If you are age 84 or younger, your body mass index should be between 19-25. Your Body mass index is 31.56 kg/m. If this is out of the aformentioned range listed, please consider follow up with your Primary Care Provider.   ________________________________________________________  The [facility] providers would like to encourage you to use MYCHART to communicate with providers for non-urgent requests or questions.  Due to long hold times on the telephone, sending your provider a message by [facility] may be a faster  and more efficient way to get a response.  Please allow 48 business hours for a response.  Please remember that this is for non-urgent requests.  _______________________________________________________

## 2023-10-14 NOTE — Progress Notes (Signed)
 10/14/2023 Sonya Lin [id number] [birth year]   CHIEF COMPLAINT: Change in bowel pattern, fecal incontinence  HISTORY OF PRESENT ILLNESS: Sonya Lin is a 58 year old female with a past medical history of osteoarthritis, anemia, hypertension and hypothyroidism.  She presents to our office today as referred by Dr. Goble Last for further evaluation regarding fecal incontinence and to discuss scheduling  a colonoscopy.  She describes passing small pellet-like rabbit stools most days and sometimes has fecal incontinence, stool is exiting the rectum before she arrives to the bathroom which has occurred for the past 3 to 6 months.  After passing a BM, she often returns back to the bathroom to pass more stool and does not feel emptied. No bloody or black stools. She feels her anal sphincter is not as tight as it used to be. She sometimes sees orange or red drainage on the toilet paper, no purulent drainage.  She has minor hemorrhoidal discomfort if she strains when passing a BM.  No significant rectal pain. She was seen by [facility] 07/2023 and a flexible sigmoidoscopy was considered but was not scheduled. She wishes to transition her GI management with [facility]. She endorsed undergoing a screening colonoscopy by a [facility] [place]  in 2018 which was normal. No known family history of colon polyps or colon cancer.      Latest Ref Rng & Units 10/02/2021    4:48 PM 03/13/2012   10:02 AM 12/29/2009    3:53 PM  CBC  WBC 3.4 - 10.8 x10E3/uL 4.8  4.2  4.4   Hemoglobin 11.1 - 15.9 g/dL 78.2  95.6  21.3   Hematocrit 34.0 - 46.6 % 44.4  39.0  38.3   Platelets 150 - 450 x10E3/uL 253  216  192        Latest Ref Rng & Units 10/02/2021    4:48 PM  CMP  Glucose 70 - 99 mg/dL 82   BUN 6 - 24 mg/dL 18   Creatinine 0.86 - 1.00 mg/dL 5.78   Sodium 469 - 629 mmol/L 139   Potassium 3.5 - 5.2 mmol/L 4.8   Chloride 96 - 106 mmol/L 103   CO2 20 - 29 mmol/L 23   Calcium 8.7 - 10.2 mg/dL  52.8   Total Protein 6.0 - 8.5 g/dL 7.3   Total Bilirubin 0.0 - 1.2 mg/dL 0.7   Alkaline Phos 44 - 121 IU/L 126   AST 0 - 40 IU/L 30   ALT 0 - 32 IU/L 18     TSH 1.450.   Past Medical History:  Diagnosis Date   Hypertension    Hyperthyroidism    Osteoarthritis    Past Surgical History:  Procedure Laterality Date   AUGMENTATION MAMMAPLASTY Bilateral    BREAST BIOPSY Right 2015   BREAST SURGERY     CERVIX LESION DESTRUCTION     HYSTEROSCOPY  03/20/2012   Procedure: HYSTEROSCOPY;  Surgeon: Cyd Dowse, MD;  Location: [facility];  Service: Gynecology;  Laterality: N/A;   IUD REMOVAL  03/20/2012   Procedure: INTRAUTERINE DEVICE (IUD) REMOVAL;  Surgeon: Cyd Dowse, MD;  Location: [facility];  Service: Gynecology;  Laterality: N/A;  Attempted Removal of Portion Intra-Uterine Device   LAPAROSCOPIC TUBAL LIGATION  03/20/2012   Procedure: LAPAROSCOPIC TUBAL LIGATION;  Surgeon: Cyd Dowse, MD;  Location: [facility];  Service: Gynecology;  Laterality: Bilateral;   Social History: She is divorced.  She has 3 sons and 1 daughter.  She is a Research scientist (physical sciences).  Non-smoker.  No alcohol use.  No drug use.  Family History: Father with hypertension, CVA and diabetes. Mother with history of diverticulitis. No known family history of colorectal cancer.   Allergies  Allergen Reactions   Penicillins Hives    Rash; whelps   Erythromycin Rash    Rash; GI upset      Outpatient Encounter Medications as of 10/14/2023  Medication Sig   amLODipine  (NORVASC ) 10 MG tablet TAKE 1 TABLET(10 MG) BY MOUTH DAILY   Blood Pressure Monitoring (BLOOD PRESSURE CUFF) MISC Check blood pressure in the morning in the morning as needed   Cyanocobalamin (VITAMIN B-12 PO) Take by mouth.   fluticasone  (FLONASE ) 50 MCG/ACT nasal spray Place 2 sprays into both nostrils daily.   ibuprofen  (ADVIL ) 400 MG tablet Take 1 tablet (400 mg total) by mouth every 6 (six) hours as needed.   methylPREDNISolone  (MEDROL  DOSEPAK) 4 MG TBPK  tablet 6 day dose pack - take as directed   No facility-administered encounter medications on file as of 10/14/2023.   REVIEW OF SYSTEMS:  Gen: + Night sweats. No weight loss.  CV: Denies chest pain, palpitations or edema. Resp: Denies cough, shortness of breath of hemoptysis.  GI: Denies heartburn, dysphagia, stomach or lower abdominal pain. No diarrhea or constipation.  GU: Denies urinary burning, blood in urine, increased urinary frequency or incontinence. MS: + Arthritis. Left shoulder bursitis and back pain. Derm: Denies rash, itchiness, skin lesions or unhealing ulcers. Psych: Denies depression, anxiety, memory loss or confusion. Heme: Denies bruising, easy bleeding. Neuro:+ Headaches. Endo:  Denies any problems with DM, thyroid  or adrenal function.  PHYSICAL EXAM: BP 116/74   Ht 4\' 7"  (1.397 m)   Wt 135 lb 12.8 oz (61.6 kg)   LMP 11/02/2021 (Exact Date)   BMI 31.56 kg/m   LMP 11/02/2021 (Exact Date)  General: 58 year old female in no acute distress. Head: Normocephalic and atraumatic. Eyes:  Sclerae non-icteric, conjunctive pink. Ears: Normal auditory acuity. Mouth: Dentition intact. No ulcers or lesions.  Neck: Supple, no lymphadenopathy or thyromegaly.  Lungs: Clear bilaterally to auscultation without wheezes, crackles or rhonchi. Heart: Regular rate and rhythm. No murmur, rub or gallop appreciated.  Abdomen: Soft, nontender, nondistended. No masses. No hepatosplenomegaly. Normoactive bowel sounds x 4 quadrants.  Rectal: No external hemorrhoids or fissures.  Anal sphincter tone mildly decreased.  No stool or palpable mass within reach in the rectal vault. Stephanie CMA present during exam. Musculoskeletal: Symmetrical with no gross deformities. Skin: Warm and dry. No rash or lesions on visible extremities. Extremities: No edema. Neurological: Alert oriented x 4, no focal deficits.  Psychological: Alert and cooperative. Normal mood and affect.  ASSESSMENT AND  PLAN:  58 year old female with an altered bowel pattern and intermittent fecal incontinence.  Patient reported undergoing a normal screening colonoscopy in 2018. - Benefiber 1 tablespoon daily - MiraLAX nightly as tolerated to increase stool output - Diagnostic colonoscopy benefits and risks discussed including risk with sedation, risk of bleeding, perforation and infection  - Request colonoscopy procedure report from GI in [place]  2018 - Consider pelvic floor physical therapy after colonoscopy completed - CBC, CMP and TSH - Further recommendations to be determined after colonoscopy completed       CC:  Goble Last, MD

## 2023-10-15 LAB — TSH: TSH: 1.54 u[IU]/mL (ref 0.35–5.50)

## 2023-10-17 ENCOUNTER — Ambulatory Visit: Payer: Self-pay | Admitting: Nurse Practitioner

## 2023-10-17 DIAGNOSIS — R748 Abnormal levels of other serum enzymes: Secondary | ICD-10-CM

## 2023-10-18 ENCOUNTER — Other Ambulatory Visit: Payer: Self-pay | Admitting: Student

## 2023-10-18 DIAGNOSIS — I10 Essential (primary) hypertension: Secondary | ICD-10-CM

## 2023-10-23 ENCOUNTER — Encounter: Payer: Self-pay | Admitting: Gastroenterology

## 2023-10-23 ENCOUNTER — Ambulatory Visit (AMBULATORY_SURGERY_CENTER): Admitting: Gastroenterology

## 2023-10-23 VITALS — BP 119/76 | HR 74 | Temp 97.5°F | Resp 18 | Ht <= 58 in | Wt 135.0 lb

## 2023-10-23 DIAGNOSIS — R194 Change in bowel habit: Secondary | ICD-10-CM

## 2023-10-23 DIAGNOSIS — R151 Fecal smearing: Secondary | ICD-10-CM

## 2023-10-23 DIAGNOSIS — K5900 Constipation, unspecified: Secondary | ICD-10-CM | POA: Diagnosis present

## 2023-10-23 MED ORDER — SODIUM CHLORIDE 0.9 % IV SOLN: 500.0000 mL | Freq: Once | INTRAVENOUS | Status: DC

## 2023-10-23 NOTE — Progress Notes (Signed)
 Pt's states no medical or surgical changes since previsit or office visit.

## 2023-10-23 NOTE — Progress Notes (Signed)
 History and Physical Interval Note:  10/23/2023 2:08 PM  Sonya Lin  has presented today for endoscopic procedure(s), with the diagnosis of  Encounter Diagnosis  Name Primary?   Constipation, unspecified constipation type Yes  .  The various methods of evaluation and treatment have been discussed with the patient and/or family. After consideration of risks, benefits and other options for treatment, the patient has consented to  the endoscopic procedure(s).   The patient's history has been reviewed, patient examined, no change in status, stable for endoscopic procedure(s).  I have reviewed the patient's chart and labs.  Questions were answered to the patient's satisfaction.     Hadlee Burback E. Cherryl Corona, MD [facility]

## 2023-10-23 NOTE — Progress Notes (Signed)
 Pt sedate, gd SR's, VSS, report to RN

## 2023-10-23 NOTE — Progress Notes (Signed)
 Agree with the assessment and plan as outlined by Alcide Evener, NP.    Zae Kirtz E. Tomasa Rand, MD [facility]

## 2023-10-23 NOTE — Op Note (Signed)
 [facility] Patient Name: Sonya Lin Procedure Date: 10/23/2023 2:00 PM MRN: [id number] Endoscopist: Geralyn Knee E. Cherryl Corona , MD, [id number] Age: 58 Referring MD:  Date of Birth: Mar 24, 1966 Gender: Female Account #: [id number] Procedure:                Colonoscopy Indications:              Change in bowel habits, Fecal incontinence Medicines:                Monitored Anesthesia Care Procedure:                Pre-Anesthesia Assessment:                           - Prior to the procedure, a History and Physical                            was performed, and patient medications and                            allergies were reviewed. The patient's tolerance of                            previous anesthesia was also reviewed. The risks                            and benefits of the procedure and the sedation                            options and risks were discussed with the patient.                            All questions were answered, and informed consent                            was obtained. Prior Anticoagulants: The patient has                            taken no anticoagulant or antiplatelet agents. ASA                            Grade Assessment: II - A patient with mild systemic                            disease. After reviewing the risks and benefits,                            the patient was deemed in satisfactory condition to                            undergo the procedure.                           After obtaining informed consent, the colonoscope  was passed under direct vision. Throughout the                            procedure, the patient's blood pressure, pulse, and                            oxygen saturations were monitored continuously. The                            Olympus Scope SN [id number] was introduced through the                            anus and advanced to the the terminal ileum, with                             identification of the appendiceal orifice and IC                            valve. The colonoscopy was performed without                            difficulty. The patient tolerated the procedure                            well. The quality of the bowel preparation was                            excellent. The terminal ileum, ileocecal valve,                            appendiceal orifice, and rectum were photographed.                            The bowel preparation used was SUPREP via split                            dose instruction. Scope In: 2:16:08 PM Scope Out: 2:25:00 PM Scope Withdrawal Time: 0 hours 5 minutes 52 seconds  Total Procedure Duration: 0 hours 8 minutes 52 seconds  Findings:                 The perianal and digital rectal examinations were                            normal. Pertinent negatives include normal                            sphincter tone and no palpable rectal lesions.                           The colon (entire examined portion) appeared normal.                           The terminal ileum appeared normal.  The retroflexed view of the distal rectum and anal                            verge was normal and showed no anal or rectal                            abnormalities. Complications:            No immediate complications. Estimated Blood Loss:     Estimated blood loss: none. Impression:               - The entire examined colon is normal.                           - The examined portion of the ileum was normal.                           - The distal rectum and anal verge are normal on                            retroflexion view.                           - No specimens collected.                           - No endoscopic abnormalities to explain patient's                            symptoms Recommendation:           - Patient has a contact number available for                            emergencies. The signs and symptoms of  potential                            delayed complications were discussed with the                            patient. Return to normal activities tomorrow.                            Written discharge instructions were provided to the                            patient.                           - Resume previous diet.                           - Continue present medications.                           - Repeat colonoscopy in 10 years for screening  purposes.                           - Continue to increase dietary fiber and take daily                            or twice daily fiber supplement. Consider pelvic                            floor physical therapy if symptoms not improving. [name], MD 10/23/2023 2:30:37 PM This report has been signed electronically.

## 2023-10-23 NOTE — Patient Instructions (Signed)
 Resume previous diet Continue present medications Repeat colonoscopy in 10 years for screening Continue to increase dietary fiber and take daily or twice daily fiber supplement. Consider pelvic floor physical therapy if symptoms are not improving YOU HAD AN ENDOSCOPIC PROCEDURE TODAY AT THE [facility]:   Refer to the procedure report that was given to you for any specific questions about what was found during the examination.  If the procedure report does not answer your questions, please call your gastroenterologist to clarify.  If you requested that your care partner not be given the details of your procedure findings, then the procedure report has been included in a sealed envelope for you to review at your convenience later.  YOU SHOULD EXPECT: Some feelings of bloating in the abdomen. Passage of more gas than usual.  Walking can help get rid of the air that was put into your GI tract during the procedure and reduce the bloating. If you had a lower endoscopy (such as a colonoscopy or flexible sigmoidoscopy) you may notice spotting of blood in your stool or on the toilet paper. If you underwent a bowel prep for your procedure, you may not have a normal bowel movement for a few days.  Please Note:  You might notice some irritation and congestion in your nose or some drainage.  This is from the oxygen used during your procedure.  There is no need for concern and it should clear up in a day or so.  SYMPTOMS TO REPORT IMMEDIATELY:  Following lower endoscopy (colonoscopy or flexible sigmoidoscopy):  Excessive amounts of blood in the stool  Significant tenderness or worsening of abdominal pains  Swelling of the abdomen that is new, acute  Fever of 100F or higher  For urgent or emergent issues, a gastroenterologist can be reached at any hour by calling [phone]. Do not use MyChart messaging for urgent concerns.    DIET:  We do recommend a small meal at first, but then you may  proceed to your regular diet.  Drink plenty of fluids but you should avoid alcoholic beverages for 24 hours.  ACTIVITY:  You should plan to take it easy for the rest of today and you should NOT DRIVE or use heavy machinery until tomorrow (because of the sedation medicines used during the test).    FOLLOW UP: Our staff will call the number listed on your records the next business day following your procedure.  We will call around 7:15- 8:00 am to check on you and address any questions or concerns that you may have regarding the information given to you following your procedure. If we do not reach you, we will leave a message.     If any biopsies were taken you will be contacted by phone or by letter within the next 1-3 weeks.  Please call us  at [phone] if you have not heard about the biopsies in 3 weeks.   SIGNATURES/CONFIDENTIALITY: You and/or your care partner have signed paperwork which will be entered into your electronic medical record.  These signatures attest to the fact that that the information above on your After Visit Summary has been reviewed and is understood.  Full responsibility of the confidentiality of this discharge information lies with you and/or your care-partner.

## 2023-10-24 ENCOUNTER — Telehealth: Payer: Self-pay

## 2023-10-24 NOTE — Telephone Encounter (Signed)
  Follow up Call-     10/23/2023    1:48 PM  Call back number  Post procedure Call Back phone  # [phone]  Permission to leave phone message Yes     Patient questions:  Do you have a fever, pain , or abdominal swelling? No. Pain Score  0 *  Have you tolerated food without any problems? Yes.    Have you been able to return to your normal activities? Yes.    Do you have any questions about your discharge instructions: Diet   No. Medications  No. Follow up visit  No.  Do you have questions or concerns about your Care? No.  Actions: * If pain score is 4 or above: No action needed, pain <4.

## 2023-11-27 ENCOUNTER — Other Ambulatory Visit

## 2023-11-27 DIAGNOSIS — R748 Abnormal levels of other serum enzymes: Secondary | ICD-10-CM | POA: Diagnosis not present

## 2023-11-27 LAB — HEPATIC FUNCTION PANEL
ALT: 18 U/L (ref 0–35)
AST: 24 U/L (ref 0–37)
Albumin: 4 g/dL (ref 3.5–5.2)
Alkaline Phosphatase: 114 U/L (ref 39–117)
Bilirubin, Direct: 0.2 mg/dL (ref 0.0–0.3)
Total Bilirubin: 0.8 mg/dL (ref 0.2–1.2)
Total Protein: 7 g/dL (ref 6.0–8.3)

## 2023-11-27 LAB — GAMMA GT: GGT: 23 U/L (ref 7–51)

## 2023-11-28 ENCOUNTER — Ambulatory Visit: Payer: Self-pay | Admitting: Nurse Practitioner

## 2023-12-09 ENCOUNTER — Ambulatory Visit

## 2023-12-09 VITALS — BP 134/89 | HR 74 | Ht <= 58 in | Wt 141.6 lb

## 2023-12-09 DIAGNOSIS — M79641 Pain in right hand: Secondary | ICD-10-CM

## 2023-12-09 NOTE — Progress Notes (Signed)
    SUBJECTIVE:   CHIEF COMPLAINT / HPI: Right hand pain  Ms. Doorn is a 58 YO Female present at [facility] with c/o right hand pain. Patient states pain worst at the base of her 3rd and 4th digits. She rate her pain as 3-4 out of 10. Pain start months ago. 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 Denies numbness or loss of sensation. Denies Trauma.  PERTINENT  PMH / PSH:  Graves disease HTN Osteoarthritis    OBJECTIVE:   BP 134/89   Pulse 74   Ht 4' 7 (1.397 m)   Wt 141 lb 9.6 oz (64.2 kg)   LMP 11/02/2021 (Exact Date)   SpO2 100%   BMI 32.91 kg/m   Physical Exam Constitutional:      Appearance: Normal appearance.  Cardiovascular:     Rate and Rhythm: Normal rate and regular rhythm.     Pulses: Normal pulses.     Heart sounds: Normal heart sounds.  Pulmonary:     Effort: Pulmonary effort is normal.     Breath sounds: Normal breath sounds.  Abdominal:     Palpations: Abdomen is soft.  Musculoskeletal:        General: Tenderness (right hand base of 3rd and 4th digits and lower back) present.  Neurological:     General: No focal deficit present.     Mental Status: She is alert and oriented to person, place, and time.  Psychiatric:        Mood and Affect: Mood normal.        Behavior: Behavior normal.      ASSESSMENT/PLAN:   Ms. Divito is a 58 YO Female present at [facility] with c/o right hand pain. Patient states pain worst at the base of her 3rd and 4th digits. Beside her hand pain she has been doing well with mild back pain with physical exam. Patient also requested physical limitation certification form to be completed during this visit Assessment & Plan Hand pain, right - Recommending Right Hand radiograph to rule out underline joint deformities - Recommending OTC pain regimen and wrist/splint for hand support especially during repetitive work - We will continue to monitor her pain   Plan discuss with Dr.McIntyre  attending physician who agreed with plan.    Houston Samuels, DO [facility]

## 2023-12-09 NOTE — Patient Instructions (Signed)
   It was great to see you!  Our plans for today:  -  Renew the restriction - Please visit radiology for right hand x-ray. Please call Call [phone] if do not herd back in a week - Warm and cold compress w/ hand support    We are checking some labs today, we will release these results to your MyChart.  Take care and seek immediate care sooner if you develop any concerns.       Sonya Lin HAS PGY 1 Family Medicine Resident [facility]  [address] [place], [place] [address] Fax [phone] Phone [phone] 12/09/2023, 4:18 PM

## 2024-02-07 ENCOUNTER — Encounter (HOSPITAL_COMMUNITY): Payer: Self-pay

## 2024-02-07 ENCOUNTER — Ambulatory Visit (HOSPITAL_COMMUNITY)
Admission: RE | Admit: 2024-02-07 | Discharge: 2024-02-07 | Disposition: A | Discharge status: Home / Self Care | Source: Ambulatory Visit

## 2024-02-07 ENCOUNTER — Ambulatory Visit (HOSPITAL_COMMUNITY)

## 2024-02-07 ENCOUNTER — Ambulatory Visit (INDEPENDENT_AMBULATORY_CARE_PROVIDER_SITE_OTHER)

## 2024-02-07 VITALS — BP 133/85 | HR 88 | Temp 98.3°F | Resp 16

## 2024-02-07 DIAGNOSIS — M79671 Pain in right foot: Secondary | ICD-10-CM

## 2024-02-07 DIAGNOSIS — M722 Plantar fascial fibromatosis: Secondary | ICD-10-CM

## 2024-02-07 MED ORDER — DICLOFENAC SODIUM 50 MG PO TBEC: 50.0000 mg | DELAYED_RELEASE_TABLET | Freq: Two times a day (BID) | ORAL | 1 refills | Status: AC

## 2024-02-07 NOTE — ED Provider Notes (Signed)
 [facility]  Note:  This document was prepared using Dragon voice recognition software and may include unintentional dictation errors.  MRN: [id number] DOB: 1966-03-05  Subjective:   Sonya Lin is a 58 y.o. female presenting for ongoing right foot/heel pain x 2 to 3 months.  Patient reports she has been using several arch supports and insoles but none of them seem to be helping her symptoms.  Patient denies any known injury or trauma.  Patient has been taking over-the-counter medication with minimal improvement.  Patient states that today she did not wear any arch support and her pain is increasing.  Denies any swelling, redness, warmth, trauma or severe injury.  No current facility-administered medications for this encounter.  Current Outpatient Medications:    diclofenac  (VOLTAREN ) 50 MG EC tablet, Take 1 tablet (50 mg total) by mouth 2 (two) times daily., Disp: 30 tablet, Rfl: 1   amLODipine  (NORVASC ) 10 MG tablet, TAKE 1 TABLET(10 MG) BY MOUTH DAILY, Disp: 90 tablet, Rfl: 1   Blood Pressure Monitoring (BLOOD PRESSURE CUFF) MISC, Check blood pressure in the morning in the morning as needed, Disp: 1 each, Rfl: 0   fluticasone  (FLONASE ) 50 MCG/ACT nasal spray, Place 2 sprays into both nostrils daily., Disp: 16 g, Rfl: 6   ibuprofen  (ADVIL ) 400 MG tablet, Take 1 tablet (400 mg total) by mouth every 6 (six) hours as needed., Disp: 30 tablet, Rfl: 0   methylPREDNISolone  (MEDROL  DOSEPAK) 4 MG TBPK tablet, 6 day dose pack - take as directed (Patient not taking: Reported on 10/23/2023), Disp: 21 tablet, Rfl: 0   Allergies  Allergen Reactions   Penicillins Hives    Rash; whelps   Erythromycin Rash    Rash; GI upset    Past Medical History:  Diagnosis Date   Hypertension    Hyperthyroidism    Osteoarthritis      Past Surgical History:  Procedure Laterality Date   AUGMENTATION MAMMAPLASTY Bilateral    BREAST BIOPSY Right 2015   BREAST SURGERY      CERVIX LESION DESTRUCTION     HYSTEROSCOPY  03/20/2012   Procedure: HYSTEROSCOPY;  Surgeon: Krystal Deaner, MD;  Location: [facility];  Service: Gynecology;  Laterality: N/A;   IUD REMOVAL  03/20/2012   Procedure: INTRAUTERINE DEVICE (IUD) REMOVAL;  Surgeon: Krystal Deaner, MD;  Location: [facility];  Service: Gynecology;  Laterality: N/A;  Attempted Removal of Portion Intra-Uterine Device   LAPAROSCOPIC TUBAL LIGATION  03/20/2012   Procedure: LAPAROSCOPIC TUBAL LIGATION;  Surgeon: Krystal Deaner, MD;  Location: [facility];  Service: Gynecology;  Laterality: Bilateral;    Family History  Problem Relation Age of Onset   Healthy Mother    Stroke Father    Hypertension Father    Diabetes Father    Colon cancer Neg Hx    Esophageal cancer Neg Hx    Stomach cancer Neg Hx    Rectal cancer Neg Hx     Social History   Tobacco Use   Smoking status: Never   Smokeless tobacco: Never  Vaping Use   Vaping status: Never Used  Substance Use Topics   Alcohol use: No   Drug use: No    ROS Refer to HPI for ROS details.  Objective:    Vitals: BP 133/85 (BP Location: Left Arm)   Pulse 88   Temp 98.3 F (36.8 C) (Oral)   Resp 16   LMP 11/02/2021 (Exact Date)   SpO2 96%   Physical Exam Vitals and  nursing note reviewed.  Constitutional:      General: She is not in acute distress.    Appearance: Normal appearance. She is well-developed. She is not ill-appearing or toxic-appearing.  HENT:     Head: Normocephalic and atraumatic.  Cardiovascular:     Rate and Rhythm: Normal rate.  Pulmonary:     Effort: Pulmonary effort is normal. No respiratory distress.  Musculoskeletal:        General: Normal range of motion.     Right foot: Normal range of motion and normal capillary refill. Tenderness and bony tenderness present. No swelling, deformity or crepitus. Normal pulse.  Skin:    General: Skin is warm and dry.  Neurological:     General: No focal deficit present.     Mental Status: She is  alert and oriented to person, place, and time.  Psychiatric:        Mood and Affect: Mood normal.        Behavior: Behavior normal.     Procedures  No results found for this or any previous visit (from the past 24 hours).  Assessment and Plan :     Discharge Instructions       1. Plantar fasciitis (Primary) - DG Foot Complete Right x-ray performed in UC shows no acute fracture or dislocation, mild heel spur noted to right heel which could be contributing to right foot pain, no acute bony injury. - diclofenac  (VOLTAREN ) 50 MG EC tablet; Take 1 tablet (50 mg total) by mouth 2 (two) times daily.  Dispense: 30 tablet; Refill: 1 - AMB referral to orthopedics for follow-up evaluation and management of right plantar fasciitis. -Continue to monitor symptoms for any change in severity if there is any escalation of current symptoms or development of new symptoms follow-up in ER for further evaluation and management.      Theoren Palka B Ainsleigh Kakos   Kristain Filo, [place], [place] 02/07/24 1914

## 2024-02-07 NOTE — ED Triage Notes (Signed)
 Pt states right foot pain mostly in the heel, states it has been for the past few months.  States she tried insoles and arch supports but after working all day the pain is bad.

## 2024-02-07 NOTE — Discharge Instructions (Addendum)
  1. Plantar fasciitis (Primary) - DG Foot Complete Right x-ray performed in UC shows no acute fracture or dislocation, mild heel spur noted to right heel which could be contributing to right foot pain, no acute bony injury. - diclofenac  (VOLTAREN ) 50 MG EC tablet; Take 1 tablet (50 mg total) by mouth 2 (two) times daily.  Dispense: 30 tablet; Refill: 1 - AMB referral to orthopedics for follow-up evaluation and management of right plantar fasciitis. -Continue to monitor symptoms for any change in severity if there is any escalation of current symptoms or development of new symptoms follow-up in ER for further evaluation and management.

## 2024-03-09 ENCOUNTER — Ambulatory Visit (INDEPENDENT_AMBULATORY_CARE_PROVIDER_SITE_OTHER): Admitting: Podiatry

## 2024-03-09 VITALS — Ht <= 58 in | Wt 141.6 lb

## 2024-03-09 DIAGNOSIS — M722 Plantar fascial fibromatosis: Secondary | ICD-10-CM

## 2024-03-09 MED ORDER — BETAMETHASONE SOD PHOS & ACET 6 (3-3) MG/ML IJ SUSP
3.0000 mg | Freq: Once | INTRAMUSCULAR | Status: AC
  Administered 2024-03-09: 3 mg via INTRA_ARTICULAR

## 2024-03-09 NOTE — Progress Notes (Signed)
   Chief Complaint  Patient presents with   Plantar Fasciitis    Rm 9 Plantar fasciitis and heel spurs    Subjective: 58 y.o. female presenting today for follow-up evaluation of plantar fasciitis to the right foot.  This has now been ongoing for about 1 year.  She works 2 jobs on her feet the majority of the day   Past Medical History:  Diagnosis Date   Hypertension    Hyperthyroidism    Osteoarthritis    Past Surgical History:  Procedure Laterality Date   AUGMENTATION MAMMAPLASTY Bilateral    BREAST BIOPSY Right 2015   BREAST SURGERY     CERVIX LESION DESTRUCTION     HYSTEROSCOPY  03/20/2012   Procedure: HYSTEROSCOPY;  Surgeon: Krystal Deaner, MD;  Location: [facility];  Service: Gynecology;  Laterality: N/A;   IUD REMOVAL  03/20/2012   Procedure: INTRAUTERINE DEVICE (IUD) REMOVAL;  Surgeon: Krystal Deaner, MD;  Location: [facility];  Service: Gynecology;  Laterality: N/A;  Attempted Removal of Portion Intra-Uterine Device   LAPAROSCOPIC TUBAL LIGATION  03/20/2012   Procedure: LAPAROSCOPIC TUBAL LIGATION;  Surgeon: Krystal Deaner, MD;  Location: [facility];  Service: Gynecology;  Laterality: Bilateral;   Allergies  Allergen Reactions   Penicillins Hives    Rash; whelps   Erythromycin Rash    Rash; GI upset     Objective: Physical Exam General: The patient is alert and oriented x3 in no acute distress.  Dermatology: Skin is warm, dry and supple bilateral lower extremities. Negative for open lesions or macerations bilateral.   Vascular: Dorsalis Pedis and Posterior Tibial pulses palpable bilateral.  Capillary fill time is immediate to all digits.  Neurological: Grossly intact via light touch  Musculoskeletal: Tenderness to palpation to the plantar aspect of the right heel along the plantar fascia. All other joints range of motion within normal limits bilateral. Strength 5/5 in all groups bilateral.   DG Foot Complete Right (Accession [id number]) (Order [id number]) Imaging Date:  02/07/2024 Department: [facility] Released By/Authorizing: Aurea Ethel NOVAK, NP (auto-released)  FINDINGS: No acute fracture or dislocation. Subtle lucency along the second proximal phalangeal shaft, likely artifactual. Small undersurface calcaneal heel spur. Soft tissues are unremarkable.   IMPRESSION: No acute fracture or dislocation.   Assessment: 1. Plantar fasciitis right  Plan of Care:  -Patient evaluated.   -Injection of 0.5 cc Celestone Soluspan injected into the plantar fascia right -Today the patient was molded for custom orthotics with our orthotics department to support the medial longitudinal arch of the foot.  This should potentially help alleviate a lot of the patient's symptoms especially with her demanding jobs -No NSAIDs.  Patient has side effects with anti-inflammatories -Recommend good supportive tennis shoes and sneakers.  Refrain from going barefoot -Return to clinic 4 weeks  *works at [employer]. Has short-term disability. Also works at [employer]  Quanika Solem M. Leeyah Heather, DPM [facility]  Dr. Thresa EMERSON Sar, DPM    [address] [place], [place] [address]                Office [phone]  Fax [phone]

## 2024-03-16 ENCOUNTER — Ambulatory Visit: Admitting: Podiatry

## 2024-04-06 ENCOUNTER — Encounter: Payer: Self-pay | Admitting: Podiatry

## 2024-04-06 ENCOUNTER — Ambulatory Visit (INDEPENDENT_AMBULATORY_CARE_PROVIDER_SITE_OTHER): Admitting: Podiatry

## 2024-04-06 VITALS — Ht <= 58 in | Wt 141.6 lb

## 2024-04-06 DIAGNOSIS — M722 Plantar fascial fibromatosis: Secondary | ICD-10-CM | POA: Diagnosis not present

## 2024-04-06 NOTE — Progress Notes (Signed)
 Chief Complaint  Patient presents with   Plantar Fasciitis    Pt is here to f/u on right foot pain due to plantar fasciitis, she states the pain is a lot better then before, has some pain after being on her feet a lot, no other complaints.    Subjective: 58 y.o. female presenting today for follow-up evaluation of plantar fasciitis to the right foot.  This has now been ongoing for about 1 year.  She works 2 jobs on her feet the majority of the day.  Overall the patient states that she is doing significantly better   Past Medical History:  Diagnosis Date   Hypertension    Hyperthyroidism    Osteoarthritis    Past Surgical History:  Procedure Laterality Date   AUGMENTATION MAMMAPLASTY Bilateral    BREAST BIOPSY Right 2015   BREAST SURGERY     CERVIX LESION DESTRUCTION     HYSTEROSCOPY  03/20/2012   Procedure: HYSTEROSCOPY;  Surgeon: Krystal Deaner, MD;  Location: [facility];  Service: Gynecology;  Laterality: N/A;   IUD REMOVAL  03/20/2012   Procedure: INTRAUTERINE DEVICE (IUD) REMOVAL;  Surgeon: Krystal Deaner, MD;  Location: [facility];  Service: Gynecology;  Laterality: N/A;  Attempted Removal of Portion Intra-Uterine Device   LAPAROSCOPIC TUBAL LIGATION  03/20/2012   Procedure: LAPAROSCOPIC TUBAL LIGATION;  Surgeon: Krystal Deaner, MD;  Location: [facility];  Service: Gynecology;  Laterality: Bilateral;   Allergies  Allergen Reactions   Penicillins Hives    Rash; whelps   Erythromycin Rash    Rash; GI upset     Objective: Physical Exam General: The patient is alert and oriented x3 in no acute distress.  Dermatology: Skin is warm, dry and supple bilateral lower extremities. Negative for open lesions or macerations bilateral.   Vascular: Dorsalis Pedis and Posterior Tibial pulses palpable bilateral.  Capillary fill time is immediate to all digits.  Neurological: Grossly intact via light touch  Musculoskeletal: Negative for any appreciable tenderness to palpation to the plantar  aspect of the right heel along the plantar fascia. All other joints range of motion within normal limits bilateral. Strength 5/5 in all groups bilateral.   DG Foot Complete Right (Accession [id number]) (Order [id number]) Imaging Date: 02/07/2024 Department: [facility] Released By/Authorizing: Aurea Ethel NOVAK, NP (auto-released)  FINDINGS: No acute fracture or dislocation. Subtle lucency along the second proximal phalangeal shaft, likely artifactual. Small undersurface calcaneal heel spur. Soft tissues are unremarkable.   IMPRESSION: No acute fracture or dislocation.   Assessment: 1. Plantar fasciitis right  Plan of Care:  -Patient evaluated.   -Overall patient states that she is feeling significantly better.  She no longer has the severe pain that she was experiencing prior -Patient also has an appointment today here in our office with the orthotics department for orthotics dispensable -No NSAIDs.  Patient has side effects with anti-inflammatories - Continue good supportive tennis shoes and sneakers.  Refrain from going barefoot -Return to clinic PRN  *works at [employer]. Has short-term disability. Also works at [employer]  Ivar Domangue M. Brittanya Winburn, DPM [facility]  Dr. Thresa EMERSON Sar, DPM    [address], [place] [address]  Office [phone]  Fax [phone]

## 2024-04-27 ENCOUNTER — Encounter: Payer: Self-pay | Admitting: Family Medicine

## 2024-04-27 ENCOUNTER — Ambulatory Visit: Admitting: Family Medicine

## 2024-04-27 VITALS — BP 139/83 | HR 81 | Ht <= 58 in | Wt 143.8 lb

## 2024-04-27 DIAGNOSIS — Z780 Asymptomatic menopausal state: Secondary | ICD-10-CM | POA: Diagnosis not present

## 2024-04-27 MED ORDER — DROSPIRENONE-ETHINYL ESTRADIOL 3-0.02 MG PO TABS: 1.0000 | ORAL_TABLET | Freq: Every day | ORAL | 5 refills | Status: AC

## 2024-04-27 NOTE — Progress Notes (Signed)
    SUBJECTIVE:   CHIEF COMPLAINT / HPI:   Menopause  Prior menstrual history/ Gestational history G10P4 Tubal ligation Has uterus Does not smoke Has not had a period in 3 years  Current symptoms Hot flashes and fatigue, absent libido  Prior management of symptoms  PERTINENT  PMH / PSH: hot flashes, BTL  OBJECTIVE:   BP 139/83   Pulse 81   Ht 4' 7 (1.397 m)   Wt 143 lb 12.8 oz (65.2 kg)   LMP 11/02/2021 (Exact Date)   SpO2 100%   BMI 33.42 kg/m   General: A&O, NAD Cardiac: RRR, no m/r/g Respiratory: CTAB, normal WOB, no w/c/r Extremities: NTTP, no peripheral edema.  ASSESSMENT/PLAN:   Assessment & Plan Postmenopausal - low risk for hormone therapy, no smoking history or personal history of breast cancer -begin combined estrogen/progesterone therapy, patient counseled on risks and benefits -follow up in one month to discuss efficacy   Lucie Pinal, DO [facility]

## 2024-04-27 NOTE — Patient Instructions (Signed)
 It was wonderful to see you today!  Start taking Yaz daily today or tomorrow. Follow up in one month to make sure the dose of your medication is enough to help with your symptoms. Once you have been on this medicine for 6 months, you will need to follow up to discuss whether you still need the medicine or not.   Please call [phone] with any questions about today's appointment.   If you need any additional refills, please call your pharmacy before calling the office.  Lucie Pinal, DO Family Medicine

## 2024-06-08 ENCOUNTER — Ambulatory Visit: Admitting: Family Medicine

## 2024-06-15 ENCOUNTER — Ambulatory Visit: Admitting: Family Medicine

## 2024-06-15 VITALS — BP 120/80 | HR 76 | Ht <= 58 in | Wt 144.6 lb

## 2024-06-15 DIAGNOSIS — R5383 Other fatigue: Secondary | ICD-10-CM | POA: Diagnosis not present

## 2024-06-15 DIAGNOSIS — N951 Menopausal and female climacteric states: Secondary | ICD-10-CM | POA: Diagnosis not present

## 2024-06-15 NOTE — Patient Instructions (Addendum)
 It was wonderful to see you today!  I am glad your hot flashes have improved. Keep an eye out for any bleeding or cramping, as if you were having a period, and stay up to date on your breast cancer screenings. As long as there are no complications, you can continue with HRT until you turn 60.   For your fatigue, we will check a couple labs, a TSH and a CBC. You should follow up in two weeks to review the results and discuss next steps. I have also placed a referral for a sleep study. They will call you to schedule and explain all the details at that time.   Please call [phone] with any questions about today's appointment.   If you need any additional refills, please call your pharmacy before calling the office.  Lucie Pinal, DO Family Medicine

## 2024-06-15 NOTE — Progress Notes (Unsigned)
" ° ° °  SUBJECTIVE:   CHIEF COMPLAINT / HPI:   Follow up on HRT - hot flashes have improved significantly - no bleeding, spotting, or breast changes  Fatigue - has been going on for about six months - sleeping 10-12 hours per night - waking up tired - if she sits for too long without doing something she will fall asleep - works in the post office, also very active in her community and with her grandchildren  PERTINENT  PMH / PSH: Post Menopausal  OBJECTIVE:   LMP 11/02/2021   ***  ASSESSMENT/PLAN:   Assessment & Plan Fatigue, unspecified type      Lucie Pinal, DO [facility] "

## 2024-06-16 ENCOUNTER — Ambulatory Visit: Payer: Self-pay | Admitting: Family Medicine

## 2024-06-16 LAB — TSH: TSH: 1.3 u[IU]/mL (ref 0.450–4.500)

## 2024-06-16 LAB — CBC WITH DIFFERENTIAL/PLATELET
Basophils Absolute: 0 x10E3/uL (ref 0.0–0.2)
Basos: 1 %
EOS (ABSOLUTE): 0.1 x10E3/uL (ref 0.0–0.4)
Eos: 2 %
Hematocrit: 44.2 % (ref 34.0–46.6)
Hemoglobin: 14.6 g/dL (ref 11.1–15.9)
Immature Grans (Abs): 0 x10E3/uL (ref 0.0–0.1)
Immature Granulocytes: 0 %
Lymphocytes Absolute: 2.2 x10E3/uL (ref 0.7–3.1)
Lymphs: 41 %
MCH: 30 pg (ref 26.6–33.0)
MCHC: 33 g/dL (ref 31.5–35.7)
MCV: 91 fL (ref 79–97)
Monocytes Absolute: 0.4 x10E3/uL (ref 0.1–0.9)
Monocytes: 7 %
Neutrophils Absolute: 2.7 x10E3/uL (ref 1.4–7.0)
Neutrophils: 49 %
Platelets: 253 x10E3/uL (ref 150–450)
RBC: 4.87 x10E6/uL (ref 3.77–5.28)
RDW: 13.4 % (ref 11.7–15.4)
WBC: 5.5 x10E3/uL (ref 3.4–10.8)

## 2024-06-16 NOTE — Assessment & Plan Note (Signed)
-   significantly improved with combined HRT - continue current dose, re-evaluate in 6 months - in theory, can be continued up to age 59

## 2024-06-18 ENCOUNTER — Telehealth: Payer: Self-pay

## 2024-06-18 NOTE — Telephone Encounter (Signed)
 Form found in RN box.   Form placed up front for pick up.   Copy made for batch scanning.   Attempted to call patient, however no answer.   When she calls back please advise of work accommodations form.
